# Patient Record
Sex: Female | Born: 1960
Health system: Southern US, Community
[De-identification: ages and names within clinical notes are randomized; demographics above are authoritative.]

## PROBLEM LIST (undated history)

## (undated) DIAGNOSIS — G7000 Myasthenia gravis without (acute) exacerbation: Secondary | ICD-10-CM

## (undated) DIAGNOSIS — N809 Endometriosis, unspecified: Secondary | ICD-10-CM

## (undated) DIAGNOSIS — E039 Hypothyroidism, unspecified: Secondary | ICD-10-CM

## (undated) DIAGNOSIS — M797 Fibromyalgia: Secondary | ICD-10-CM

## (undated) DIAGNOSIS — M069 Rheumatoid arthritis, unspecified: Secondary | ICD-10-CM

## (undated) HISTORY — DX: Rheumatoid arthritis, unspecified: M06.9

## (undated) HISTORY — PX: FOOT SURGERY: SHX648

## (undated) HISTORY — DX: Fibromyalgia: M79.7

## (undated) HISTORY — PX: TONSILECTOMY/ADENOIDECTOMY WITH MYRINGOTOMY: SHX6125

## (undated) HISTORY — DX: Hypothyroidism, unspecified: E03.9

## (undated) HISTORY — DX: Endometriosis, unspecified: N80.9

## (undated) HISTORY — PX: FEMORAL HERNIA REPAIR: SUR1179

---

## 1998-10-23 ENCOUNTER — Other Ambulatory Visit: Admission: RE | Admit: 1998-10-23 | Discharge: 1998-10-23 | Payer: Self-pay | Admitting: Obstetrics and Gynecology

## 1999-08-02 ENCOUNTER — Ambulatory Visit (HOSPITAL_COMMUNITY): Admission: RE | Admit: 1999-08-02 | Discharge: 1999-08-02 | Payer: Self-pay

## 1999-10-24 ENCOUNTER — Other Ambulatory Visit: Admission: RE | Admit: 1999-10-24 | Discharge: 1999-10-24 | Payer: Self-pay | Admitting: Obstetrics and Gynecology

## 2000-03-26 ENCOUNTER — Ambulatory Visit (HOSPITAL_COMMUNITY): Admission: RE | Admit: 2000-03-26 | Discharge: 2000-03-26 | Payer: Self-pay | Admitting: Neurology

## 2000-11-28 ENCOUNTER — Other Ambulatory Visit: Admission: RE | Admit: 2000-11-28 | Discharge: 2000-11-28 | Payer: Self-pay | Admitting: Obstetrics and Gynecology

## 2001-11-30 ENCOUNTER — Other Ambulatory Visit: Admission: RE | Admit: 2001-11-30 | Discharge: 2001-11-30 | Payer: Self-pay | Admitting: *Deleted

## 2002-12-22 ENCOUNTER — Other Ambulatory Visit: Admission: RE | Admit: 2002-12-22 | Discharge: 2002-12-22 | Payer: Self-pay | Admitting: Obstetrics and Gynecology

## 2003-04-13 ENCOUNTER — Encounter: Payer: Self-pay | Admitting: General Surgery

## 2003-04-13 ENCOUNTER — Encounter: Admission: RE | Admit: 2003-04-13 | Discharge: 2003-04-13 | Payer: Self-pay | Admitting: General Surgery

## 2003-06-02 ENCOUNTER — Emergency Department (HOSPITAL_COMMUNITY): Admission: EM | Admit: 2003-06-02 | Discharge: 2003-06-03 | Payer: Self-pay | Admitting: *Deleted

## 2003-08-05 ENCOUNTER — Encounter: Payer: Self-pay | Admitting: Family Medicine

## 2003-08-05 ENCOUNTER — Encounter: Admission: RE | Admit: 2003-08-05 | Discharge: 2003-08-05 | Payer: Self-pay | Admitting: Family Medicine

## 2003-12-19 ENCOUNTER — Encounter: Admission: RE | Admit: 2003-12-19 | Discharge: 2003-12-19 | Payer: Self-pay | Admitting: Family Medicine

## 2004-01-19 ENCOUNTER — Other Ambulatory Visit: Admission: RE | Admit: 2004-01-19 | Discharge: 2004-01-19 | Payer: Self-pay | Admitting: Obstetrics and Gynecology

## 2004-12-27 ENCOUNTER — Ambulatory Visit (HOSPITAL_COMMUNITY): Admission: RE | Admit: 2004-12-27 | Discharge: 2004-12-27 | Payer: Self-pay | Admitting: Obstetrics and Gynecology

## 2005-02-21 ENCOUNTER — Encounter: Admission: RE | Admit: 2005-02-21 | Discharge: 2005-02-21 | Payer: Self-pay | Admitting: Obstetrics and Gynecology

## 2005-05-10 ENCOUNTER — Ambulatory Visit (HOSPITAL_COMMUNITY): Admission: RE | Admit: 2005-05-10 | Discharge: 2005-05-10 | Payer: Self-pay | Admitting: Obstetrics and Gynecology

## 2005-12-27 ENCOUNTER — Other Ambulatory Visit: Admission: RE | Admit: 2005-12-27 | Discharge: 2005-12-27 | Payer: Self-pay | Admitting: Obstetrics and Gynecology

## 2007-02-23 ENCOUNTER — Encounter: Admission: RE | Admit: 2007-02-23 | Discharge: 2007-02-23 | Payer: Self-pay | Admitting: Obstetrics and Gynecology

## 2008-07-11 ENCOUNTER — Emergency Department (HOSPITAL_COMMUNITY): Admission: EM | Admit: 2008-07-11 | Discharge: 2008-07-11 | Payer: Self-pay | Admitting: Emergency Medicine

## 2008-11-24 ENCOUNTER — Ambulatory Visit (HOSPITAL_BASED_OUTPATIENT_CLINIC_OR_DEPARTMENT_OTHER): Admission: RE | Admit: 2008-11-24 | Discharge: 2008-11-24 | Payer: Self-pay | Admitting: Surgery

## 2008-11-24 ENCOUNTER — Encounter (INDEPENDENT_AMBULATORY_CARE_PROVIDER_SITE_OTHER): Payer: Self-pay | Admitting: Surgery

## 2009-03-09 ENCOUNTER — Encounter: Admission: RE | Admit: 2009-03-09 | Discharge: 2009-03-09 | Payer: Self-pay | Admitting: Gastroenterology

## 2009-03-23 ENCOUNTER — Encounter: Admission: RE | Admit: 2009-03-23 | Discharge: 2009-03-23 | Payer: Self-pay | Admitting: Obstetrics and Gynecology

## 2009-05-25 ENCOUNTER — Ambulatory Visit (HOSPITAL_COMMUNITY): Admission: RE | Admit: 2009-05-25 | Discharge: 2009-05-25 | Payer: Self-pay | Admitting: Gastroenterology

## 2010-03-22 ENCOUNTER — Encounter: Admission: RE | Admit: 2010-03-22 | Discharge: 2010-03-22 | Payer: Self-pay | Admitting: Family Medicine

## 2010-08-31 ENCOUNTER — Ambulatory Visit (HOSPITAL_COMMUNITY)
Admission: RE | Admit: 2010-08-31 | Discharge: 2010-08-31 | Payer: Self-pay | Source: Home / Self Care | Admitting: Family Medicine

## 2011-01-06 ENCOUNTER — Encounter: Payer: Self-pay | Admitting: Orthopedic Surgery

## 2011-01-06 ENCOUNTER — Encounter: Payer: Self-pay | Admitting: Obstetrics and Gynecology

## 2011-01-15 ENCOUNTER — Encounter
Admission: RE | Admit: 2011-01-15 | Discharge: 2011-01-15 | Payer: Self-pay | Source: Home / Self Care | Attending: Family Medicine | Admitting: Family Medicine

## 2011-04-30 NOTE — Op Note (Signed)
NAMESEQUOYA, [name]               ACCOUNT NO.:  [id number]   MEDICAL RECORD NO.:  [id number]          PATIENT TYPE:  AMB   LOCATION:  DSC                          FACILITY:  [facility]   PHYSICIAN:  Sandria Bales. Ezzard Standing, M.D.  DATE OF BIRTH:  Sep 05, 1961   DATE OF PROCEDURE:  11/24/2008  DATE OF DISCHARGE:                               OPERATIVE REPORT   Date of surgery ?   PREOPERATIVE DIAGNOSIS:  Right groin mass.   POSTOPERATIVE DIAGNOSIS:  Right femoral hernia.  Chronically  incarcerated.   PROCEDURE:  Open right femoral hernia repair with mesh.   SURGEON:  Sandria Bales. Ezzard Standing, MD   FIRST ASSISTANT:  None.   ANESTHESIA:  General endotracheal.   ESTIMATED BLOOD LOSS:  Minimal.   INDICATIONS FOR PROCEDURE:  Angel Roberts is a 50 year old white female who  is a patient of Dr. Merri Roberts, who sees Dr. Richarda Roberts from a  GYN standpoint, who has had a right groin mass that she has noticed for  several months.  She actually had a CT scan of this which showed a 1.6 x  2.3 cystic mass in her right groin and was hard to tell on physical exam  whether this was some lymph node or femoral hernia versus some other  kind of mass.   The area was not hurting her.  She was just conscious of it.  We  discussed about the options for treatment, but she decided she will be  best served by having this excised.   I discussed with her the risks of surgery which includes bleeding,  infection, and nerve injury and if this is a hernia, we have to do some  kind of hernia repair.   OPERATIVE NOTE:  The patient was placed in supine position and given a  general endotracheal anesthetic.  She was given 1 g of Ancef at the  initiation of the procedure.  I ultrasound'ed this area at the beginning  of the procedure to make sure I had the location right and marked her  skin.   I then made a right inguinal incision, cut down to this mass which was  immediately below the inguinal ligament.  It was fairly clear  once I got  top of this, this was a component of the femoral hernia with sort of  cystic degeneration in it.   I then opened the external oblique fascia, reduced the hernia above the  ilioinguinal ligament.  I ligated this hernia sac after opening and saw  no bowel or bladder in it.  I then divided off the round ligament of the  pubic bone and ligated this at the internal ring/canal with a 0 chromic  suture.  I ligated the hernia sac with 0 chromic suture.  I then used a  piece of Prolene mesh to do an inguinal floor repair.  I sewed the mesh  medially to pubic bone inferiorly to Cooper ligament, superiorly to the  transversalis fascia but covered both the femoral hernia defect and the  internal ring defect.  I used interrupted 0 Novafil sutures for this  repair.   I then infiltrated the fascial spaces with 20cc .25% Marcaine.  I closed  the external oblique fascia with interrupted 3-0 Vicryl suture,  subcutaneous tissues with 3-0 Vicryl suture, and skin with a 4-0 Vicryl  suture, painted with benzoin and Steri-Strip'ed.   The patient tolerated the procedure well.  This was done as an  outpatient, will go home today.  Sponge and needle counts were correct at the end of the case and the  patient transferred to recovery room in good condition.      Sandria Bales. Ezzard Standing, M.D.  Electronically Signed     DHN/MEDQ  D:  11/24/2008  T:  11/25/2008  Job:  [id number]   cc:   Angel Roberts, M.D.  Angel Roberts, M.D.

## 2011-05-03 NOTE — H&P (Signed)
Angel Roberts, Angel Roberts               ACCOUNT NO.:  [id number]   MEDICAL RECORD NO.:  [id number]          PATIENT TYPE:  AMB   LOCATION:  SDC                           [facility]ILITY:  [facility]   PHYSICIAN:  Duke Salvia. Marcelle Overlie, M.D.DATE OF BIRTH:  03-02-1961   DATE OF ADMISSION:  DATE OF DISCHARGE:                                HISTORY & PHYSICAL   DATE OF OUTPATIENT SURGERY:  December 27, 2004   CHIEF COMPLAINT:  Menorrhagia.   HISTORY OF PRESENT ILLNESS:  A 50 year old G3 P3, her husband has had a  vasectomy, with menorrhagia.  She underwent D&C hysteroscopy October 2004 to  remove a polyp.  Findings at that time showed benign pathology but she  continues to have heavy bleeding and presents now for NovaSure EMA.  This  procedure including risks of bleeding, infection, other complications that  may require open or additional surgery all discussed with her, which she  understands and accepts.  Prior [facility] done September 2004 was normal except  for a small fibroid and several small polyps which have since been removed.   PAST MEDICAL HISTORY:  Allergies:  INDOCIN, FLAGYL, SULFASALAZINE,  ERYTHROMYCIN.  Current medications:  Synthroid, prednisone, Plaquenil for  rheumatoid arthritis.   REVIEW OF SYSTEMS:  Significant for rheumatoid arthritis.  She has had three  vaginal deliveries at term without complication.  Also history of  fibromyalgia followed by Dr. Estill Bakes.   FAMILY HISTORY:  Significant for a father and father-in-law with  hypertension.  Mother who had skin cancer.   PHYSICAL EXAMINATION:  VITAL SIGNS:  Temperature 98.2, blood pressure  120/72.  HEENT:  Unremarkable.  NECK:  Supple without masses.  LUNGS:  Clear.  CARDIOVASCULAR:  Regular rate and rhythm without murmurs, rubs, or gallops  noted.  BREASTS:  Without masses.  ABDOMEN:  Soft, flat, and nontender.  PELVIC:  Normal external genitalia, vagina and cervix clear.  Uterus mid  position, normal size.  Adnexa negative.  EXTREMITIES AND NEUROLOGIC:  Unremarkable.   IMPRESSION:  Persistent menorrhagia status post D&C hysteroscopy.   PLAN:  NovaSure endometrial ablation.  Procedure and risks reviewed as  above.     Rich   RMH/MEDQ  D:  12/24/2004  T:  12/24/2004  Job:  [id number]

## 2011-05-03 NOTE — Op Note (Signed)
Angel Roberts, Angel Roberts               ACCOUNT NO.:  [id number]   MEDICAL RECORD NO.:  [id number]          PATIENT TYPE:  AMB   LOCATION:  SDC                           [facility]ILITY[facility]  [facility]   PHYSICIAN:  Duke Salvia. Marcelle Overlie, M.D.DATE OF BIRTH:  Sep 14, 1961   DATE OF PROCEDURE:  12/27/2004  DATE OF DISCHARGE:                                 OPERATIVE REPORT   PREOPERATIVE DIAGNOSIS:  Menorrhagia.   POSTOPERATIVE DIAGNOSIS:  Menorrhagia.   PROCEDURE:  NovaSure endometrial ablation.   SURGEON:  Duke Salvia. Marcelle Overlie, M.D.   ANESTHESIA:  Sedation plus paracervical block.   ESTIMATED BLOOD LOSS:  Less than 5 mL.   PROCEDURE AND FINDINGS:  The patient was taken to the operating room.  After  an adequate level of sedation was obtained with the patient's legs in  stirrups, the perineum and vagina were prepped and draped in the usual  manner for vaginal procedures.  The bladder was drained, EUA carried out.  The uterus was normal size, mobile, adnexa negative.  A speculum was  positioned, paracervical block created by infiltrating at 3 and 9 o'clock  submucosally, 5-7 mL of 1% Xylocaine on either side after a negative  aspiration.  The uterus was then sounded to 9.5 cm, progressively dilated to  a 29 Pratt dilator.  The endocervical measurement was 3.5.  The NovaSure  instrument was then placed per protocol at a fundal cavity setting of 6 cm  with a width measurement of 4.5.  Passed the CO2 test and procedure carried  out 1 minute 14 seconds at a power of 149.  She tolerated this well, went to  the recovery room in good condition.     Rich   RMH/MEDQ  D:  12/27/2004  T:  12/27/2004  Job:  [id number]

## 2011-09-20 LAB — POCT HEMOGLOBIN-HEMACUE: Hemoglobin: 12.8 g/dL (ref 12.0–15.0)

## 2012-02-18 ENCOUNTER — Other Ambulatory Visit: Payer: Self-pay | Admitting: Obstetrics and Gynecology

## 2012-02-18 DIAGNOSIS — N63 Unspecified lump in unspecified breast: Secondary | ICD-10-CM

## 2012-02-26 ENCOUNTER — Ambulatory Visit
Admission: RE | Admit: 2012-02-26 | Discharge: 2012-02-26 | Disposition: A | Discharge status: Home / Self Care | Payer: BC Managed Care – PPO | Source: Ambulatory Visit | Attending: Obstetrics and Gynecology | Admitting: Obstetrics and Gynecology

## 2012-02-26 DIAGNOSIS — N63 Unspecified lump in unspecified breast: Secondary | ICD-10-CM

## 2012-09-07 ENCOUNTER — Other Ambulatory Visit: Payer: Self-pay | Admitting: Rheumatology

## 2012-09-07 DIAGNOSIS — M5412 Radiculopathy, cervical region: Secondary | ICD-10-CM

## 2012-09-10 ENCOUNTER — Other Ambulatory Visit: Payer: BC Managed Care – PPO

## 2013-01-12 ENCOUNTER — Other Ambulatory Visit: Payer: Self-pay | Admitting: Rheumatology

## 2013-01-12 DIAGNOSIS — M629 Disorder of muscle, unspecified: Secondary | ICD-10-CM

## 2013-01-16 ENCOUNTER — Ambulatory Visit
Admission: RE | Admit: 2013-01-16 | Discharge: 2013-01-16 | Disposition: A | Discharge status: Home / Self Care | Payer: BC Managed Care – PPO | Source: Ambulatory Visit | Attending: Rheumatology | Admitting: Rheumatology

## 2013-01-16 DIAGNOSIS — M629 Disorder of muscle, unspecified: Secondary | ICD-10-CM

## 2013-01-16 MED ORDER — GADOBENATE DIMEGLUMINE 529 MG/ML IV SOLN
13.0000 mL | Freq: Once | INTRAVENOUS | Status: AC | PRN
  Administered 2013-01-16: 13 mL via INTRAVENOUS

## 2014-10-12 ENCOUNTER — Other Ambulatory Visit: Payer: Self-pay | Admitting: Obstetrics and Gynecology

## 2014-10-13 LAB — CYTOLOGY - PAP

## 2014-12-14 ENCOUNTER — Encounter: Payer: Self-pay | Admitting: Neurology

## 2014-12-19 ENCOUNTER — Ambulatory Visit (INDEPENDENT_AMBULATORY_CARE_PROVIDER_SITE_OTHER): Payer: BC Managed Care – PPO | Admitting: Neurology

## 2014-12-19 ENCOUNTER — Encounter: Payer: Self-pay | Admitting: Neurology

## 2014-12-19 VITALS — BP 126/70 | HR 70 | Temp 98.0°F | Resp 16 | Ht 68.0 in | Wt 155.7 lb

## 2014-12-19 DIAGNOSIS — R531 Weakness: Secondary | ICD-10-CM

## 2014-12-19 DIAGNOSIS — H02403 Unspecified ptosis of bilateral eyelids: Secondary | ICD-10-CM

## 2014-12-19 LAB — CK: Total CK: 499 U/L — ABNORMAL HIGH (ref 7–177)

## 2014-12-19 NOTE — Progress Notes (Addendum)
NEUROLOGY CONSULTATION NOTE  MERRISA SKORUPSKI MRN: [id number] DOB: 23-Sep-1961  Referring provider: Dr. Dema Severin Primary care provider: Dr. Dema Severin  Reason for consult:  Bilateral ptosis  HISTORY OF PRESENT ILLNESS: Angel Roberts is a 54 year old right-handed woman with rheumatoid arthritis, hypothyroidism and fibromyalgia who presents for bilateral ptosis and low back pain.  Records reviewed.  She has been noticing symptoms for a few months.  She has noticed that her eyes are drooping.  Left is worse than right.  Sometimes, her vision seems blurry and difficult to focus.  She also notes weakness and fatigue involving her arms and legs.  It is difficult to walk up the stairs at time.  She also occasionally notes difficulty swallowing as well as mildly out of breath.  Her head feels heavy sometimes.  These symptoms fluctuate but mostly are more evident later in the day.  She initially had some pain in the arms, but not recently.  She has rheumatoid arthritis and fibromyalgia, for which she is taking Plaquenil and naproxen.  PAST MEDICAL HISTORY: Past Medical History  Diagnosis Date  . Endometriosis   . Rheumatoid arthritis   . Fibromyalgia   . Hypothyroid     PAST SURGICAL HISTORY: Past Surgical History  Procedure Laterality Date  . Foot surgery      MEDICATIONS: Current Outpatient Prescriptions on File Prior to Visit  Medication Sig Dispense Refill  . hydroxychloroquine (PLAQUENIL) 200 MG tablet Take 200 mg by mouth 2 (two) times daily.    Marland Kitchen levothyroxine (SYNTHROID, LEVOTHROID) 125 MCG tablet Take 125 mcg by mouth daily before breakfast.    . naproxen (NAPROSYN) 500 MG tablet Take 500 mg by mouth 2 (two) times daily with a meal.     No current facility-administered medications on file prior to visit.    ALLERGIES: Allergies  Allergen Reactions  . Augmentin [Amoxicillin-Pot Clavulanate] Diarrhea  . Erythromycin Nausea And Vomiting  . Flagyl [Metronidazole]     fainting    . Indocin [Indomethacin]     Headache   . Sulfa Antibiotics     FAMILY HISTORY: Family History  Problem Relation Age of Onset  . Hypertension Mother   . Asthma Mother   . Hypertension Father   . HIV Brother   . Thyroid disease Daughter   . Stroke Paternal Grandmother   . COPD Paternal Grandfather     SOCIAL HISTORY: History   Social History  . Marital Status: Married    Spouse Name: N/A    Number of Children: N/A  . Years of Education: N/A   Occupational History  . Not on file.   Social History Main Topics  . Smoking status: Never Smoker   . Smokeless tobacco: Never Used  . Alcohol Use: 0.0 oz/week    0 Not specified per week     Comment: rare  . Drug Use: No     Comment: PATIENT FEELS SAFE IN HER HOME .  Marland Kitchen Sexual Activity:    Partners: Male   Other Topics Concern  . Not on file   Social History Narrative    REVIEW OF SYSTEMS: Constitutional: No fevers, chills, or sweats, no generalized fatigue, change in appetite Eyes: No visual changes, double vision, eye pain Ear, nose and throat: No hearing loss, ear pain, nasal congestion, sore throat Cardiovascular: No chest pain, palpitations Respiratory:  No shortness of breath at rest or with exertion, wheezes GastrointestinaI: No nausea, vomiting, diarrhea, abdominal pain, fecal incontinence Genitourinary:  No dysuria, urinary  retention or frequency Musculoskeletal:  Back and neck pain Integumentary: No rash, pruritus, skin lesions Neurological: as above Psychiatric: No depression, insomnia, anxiety Endocrine: No palpitations, fatigue, diaphoresis, mood swings, change in appetite, change in weight, increased thirst Hematologic/Lymphatic:  No anemia, purpura, petechiae. Allergic/Immunologic: no itchy/runny eyes, nasal congestion, recent allergic reactions, rashes  PHYSICAL EXAM: Filed Vitals:   12/19/14 1510  BP: 126/70  Pulse: 70  Temp: 98 F (36.7 C)  Resp: 16   General: No acute distress Head:   Normocephalic/atraumatic Eyes:  fundi unremarkable, without vessel changes, exudates, hemorrhages or papilledema. Neck: supple, no paraspinal tenderness, full range of motion Back: No paraspinal tenderness Heart: regular rate and rhythm Lungs: Clear to auscultation bilaterally. Vascular: No carotid bruits. Neurological Exam: Mental status: alert and oriented to person, place, and time, recent and remote memory intact, fund of knowledge intact, attention and concentration intact, speech fluent and not dysarthric, language intact. Cranial nerves: CN I: not tested CN II: pupils equal, round and reactive to light, visual fields intact, fundi unremarkable, without vessel changes, exudates, hemorrhages or papilledema. CN III, IV, VI:  full range of motion, no nystagmus, mild bilateral ptosis which does not appear to fatigue or improve with ice CN V: endorses very mild numbness in left V2 distribution. CN VII: upper and lower face symmetric CN VIII: hearing intact CN IX, X: gag intact, uvula midline CN XI: sternocleidomastoid and trapezius muscles intact CN XII: tongue midline Bulk & Tone: normal, no fasciculations. Motor:  5/5 throughout.  4+/5 neck flexion and extension, however reduced effort due to fear of producing neck pain. Sensation:  Temperature and vibration intact. Deep Tendon Reflexes:  2+ throughout, toes downgoing. Finger to nose testing:  No dysmetria Heel to shin:  No dysmeria Gait:  Normal station and stride.  Able to turn and walk in tandem. Romberg negative.  IMPRESSION: Bilateral ptosis Diffuse weakness Need to rule out myasthenia gravis.  PLAN: 1.  Will check Myasthenia panel and CK 2.  If negative, will check repetitive stimulation test on nerve conduction study 3.  If labs positive, will start Mestinon. 4.  Follow up in 4 weeks or after EMG if performed.  Thank you for allowing me to take part in the care of this patient.  Metta Clines, DO  CC: Harlan Stains,  MD

## 2014-12-19 NOTE — Progress Notes (Signed)
, °

## 2014-12-19 NOTE — Addendum Note (Signed)
Addended byTomi Likens, Zania Kalisz R on: 12/19/2014 04:20 PM   Modules accepted: Level of Service

## 2014-12-19 NOTE — Patient Instructions (Signed)
We will check blood work looking for evidence of myasthenia gravis. If the blood work is negative, we will order a nerve conduction study to test for it. I would like to see you in 4 weeks.  If blood work is positive, we will start a medication for myasthenia (Mestinon)

## 2014-12-26 LAB — ACETYLCHOLINE RECEPTOR, BINDING: A CHR BINDING ABS: 0.53 nmol/L — AB (ref <=0.30)

## 2014-12-26 LAB — STRIATED MUSCLE ANTIBODY: Striated Muscle Ab: <1:40 {titer}

## 2014-12-27 ENCOUNTER — Telehealth: Payer: Self-pay | Admitting: *Deleted

## 2014-12-27 ENCOUNTER — Other Ambulatory Visit: Payer: Self-pay | Admitting: *Deleted

## 2014-12-27 MED ORDER — PYRIDOSTIGMINE BROMIDE 60 MG PO TABS: 60.0000 mg | ORAL_TABLET | Freq: Three times a day (TID) | ORAL | Status: DC

## 2014-12-27 NOTE — Telephone Encounter (Signed)
-----   Message from Alda Berthold, DO sent at 12/27/2014  9:01 AM EST ----- Please inform patient that her labs are suggestive of an autoimmune disease that can cause muscle weakness, myasthenia gravis, and we can start mestinon 60mg  three times daily.  Her CK may need to be rechecked again in a month.  Thanks.

## 2014-12-27 NOTE — Telephone Encounter (Signed)
Patient is aware of lab results and medication was sent to [facility]

## 2015-01-30 ENCOUNTER — Ambulatory Visit: Payer: BC Managed Care – PPO | Admitting: Neurology

## 2015-02-03 ENCOUNTER — Telehealth: Payer: Self-pay | Admitting: *Deleted

## 2015-02-03 NOTE — Telephone Encounter (Signed)
Patient called asking if records were sent to Dr Nadara Mustard and [facility] I advised her that the request goes to [facility] and they pull the records and fax to the requested DR

## 2015-02-15 ENCOUNTER — Encounter: Payer: Self-pay | Admitting: Neurology

## 2015-02-15 ENCOUNTER — Ambulatory Visit (INDEPENDENT_AMBULATORY_CARE_PROVIDER_SITE_OTHER): Payer: BLUE CROSS/BLUE SHIELD | Admitting: Neurology

## 2015-02-15 VITALS — BP 122/68 | HR 78 | Resp 16 | Ht 68.0 in | Wt 154.3 lb

## 2015-02-15 DIAGNOSIS — R0602 Shortness of breath: Secondary | ICD-10-CM

## 2015-02-15 DIAGNOSIS — D4989 Neoplasm of unspecified behavior of other specified sites: Secondary | ICD-10-CM

## 2015-02-15 DIAGNOSIS — D150 Benign neoplasm of thymus: Secondary | ICD-10-CM

## 2015-02-15 MED ORDER — PYRIDOSTIGMINE BROMIDE 60 MG PO TABS: 60.0000 mg | ORAL_TABLET | Freq: Four times a day (QID) | ORAL | Status: DC

## 2015-02-15 NOTE — Progress Notes (Signed)
NEUROLOGY FOLLOW UP OFFICE NOTE  [name] [id number]  HISTORY OF PRESENT ILLNESS: Angel Roberts is a 54 year old right-handed woman with rheumatoid arthritis, hypothyroidism and fibromyalgia who follows up for bilateral ptosis.  Labs reviewed.  UPDATE: Myasthenia panel showed elevated Ach binding antibodies of 0.53.  Striated muscle antibodies were negative.  She was started on Mestinon 60mg  three times daily.  At first, she noted diarrhea, but has since adjusted.  She feels that the ptosis has improved somewhat.  She sometimes has trouble swallowing food and will need to drink some water to wash it down.  When she is active, she feels short of breath.   HISTORY: She has been noticing symptoms for a few months.  She has noticed that her eyes are drooping.  Left is worse than right.  Sometimes, her vision seems blurry and difficult to focus.  She also notes weakness and fatigue involving her arms and legs.  It is difficult to walk up the stairs at time.  She also occasionally notes difficulty swallowing as well as mildly out of breath.  Her head feels heavy sometimes.  These symptoms fluctuate but mostly are more evident later in the day.  She initially had some pain in the arms, but not recently.  She has hypothyroidism, rheumatoid arthritis and fibromyalgia, for which she is taking Synthroid, Plaquenil and naproxen.  PAST MEDICAL HISTORY: Past Medical History  Diagnosis Date  . Endometriosis   . Rheumatoid arthritis   . Fibromyalgia   . Hypothyroid     MEDICATIONS: Current Outpatient Prescriptions on File Prior to Visit  Medication Sig Dispense Refill  . hydroxychloroquine (PLAQUENIL) 200 MG tablet Take 200 mg by mouth 2 (two) times daily.    Marland Kitchen levothyroxine (SYNTHROID, LEVOTHROID) 125 MCG tablet Take 125 mcg by mouth daily before breakfast.    . naproxen (NAPROSYN) 500 MG tablet Take 500 mg by mouth 2 (two) times daily with a meal.     No current facility-administered  medications on file prior to visit.    ALLERGIES: Allergies  Allergen Reactions  . Other Anaphylaxis    Shell fish   . Augmentin [Amoxicillin-Pot Clavulanate] Diarrhea  . Erythromycin Nausea And Vomiting  . Flagyl [Metronidazole]     fainting  . Indocin [Indomethacin]     Headache   . Sulfa Antibiotics     FAMILY HISTORY: Family History  Problem Relation Age of Onset  . Hypertension Mother   . Asthma Mother   . Hypertension Father   . HIV Brother   . Thyroid disease Daughter   . Stroke Paternal Grandmother   . COPD Paternal Grandfather     SOCIAL HISTORY: History   Social History  . Marital Status: Married    Spouse Name: N/A  . Number of Children: N/A  . Years of Education: N/A   Occupational History  . Not on file.   Social History Main Topics  . Smoking status: Never Smoker   . Smokeless tobacco: Never Used  . Alcohol Use: 0.0 oz/week    0 Standard drinks or equivalent per week     Comment: rare  . Drug Use: No     Comment: PATIENT FEELS SAFE IN HER HOME .  Marland Kitchen Sexual Activity:    Partners: Male   Other Topics Concern  . Not on file   Social History Narrative    REVIEW OF SYSTEMS: Constitutional: No fevers, chills, or sweats, no generalized fatigue, change in appetite Eyes: No visual changes,  double vision, eye pain Ear, nose and throat: No hearing loss, ear pain, nasal congestion, sore throat Cardiovascular: No chest pain, palpitations Respiratory:  As above. GastrointestinaI: No nausea, vomiting, diarrhea, abdominal pain, fecal incontinence Genitourinary:  No dysuria, urinary retention or frequency Musculoskeletal:  No neck pain, back pain Integumentary: No rash, pruritus, skin lesions Neurological: as above Psychiatric: No depression, insomnia, anxiety Endocrine: No palpitations, fatigue, diaphoresis, mood swings, change in appetite, change in weight, increased thirst Hematologic/Lymphatic:  No anemia, purpura,  petechiae. Allergic/Immunologic: no itchy/runny eyes, nasal congestion, recent allergic reactions, rashes  PHYSICAL EXAM: Filed Vitals:   02/15/15 1341  BP: 122/68  Pulse: 78  Resp: 16   General: No acute distress Head:  Normocephalic/atraumatic Eyes:  Fundoscopic exam unremarkable without vessel changes, exudates, hemorrhages or papilledema. Neck: supple, no paraspinal tenderness, full range of motion Heart:  Regular rate and rhythm Lungs:  Clear to auscultation bilaterally Back: No paraspinal tenderness Neurological Exam: alert and oriented to person, place, and time. Attention span and concentration intact, recent and remote memory intact, fund of knowledge intact.  Speech fluent and not dysarthric, language intact.  Bilateral ptosis.  Otherwise CN II-XII intact. Fundoscopic exam unremarkable without vessel changes, exudates, hemorrhages or papilledema.  Bulk and tone normal, neck flexion 4+/5, otherwise muscle strength 5/5 throughout.  Sensation to light touch, temperature and vibration intact.  Deep tendon reflexes 2+ throughout, toes downgoing.  Finger to nose and heel to shin testing intact.  Gait normal  IMPRESSION: Myasthenia gravis  PLAN: 1.  Will increase Mestinon to 60mg  4 times daily 2.  Will check CT of chest for thymoma 3.  Follow up in 2 months but she is instructed to call sooner with any issues (medication side effects, worsening symptoms, etc.)  15 minutes spent with patient, over 50% spent discussing physiology of condition and coordinating care.  Metta Clines, DO  CC:  Carol Ada, MD

## 2015-02-15 NOTE — Patient Instructions (Addendum)
1.  Increase Mestinon 60mg  to 4 times daily 2.  Will get CT of chest to evaluate for thymoma [facility]  02/20/15 at 7:45am  3.  Follow up in 2 months but please call with any concerns (side effects to Mestinon, worsening symptoms)

## 2015-02-20 ENCOUNTER — Ambulatory Visit (HOSPITAL_COMMUNITY)
Admission: RE | Admit: 2015-02-20 | Discharge: 2015-02-20 | Disposition: A | Discharge status: Home / Self Care | Payer: BLUE CROSS/BLUE SHIELD | Source: Ambulatory Visit | Attending: Neurology | Admitting: Neurology

## 2015-02-20 DIAGNOSIS — R0602 Shortness of breath: Secondary | ICD-10-CM

## 2015-02-20 DIAGNOSIS — R918 Other nonspecific abnormal finding of lung field: Secondary | ICD-10-CM | POA: Diagnosis not present

## 2015-02-20 DIAGNOSIS — D150 Benign neoplasm of thymus: Secondary | ICD-10-CM

## 2015-02-20 DIAGNOSIS — D4989 Neoplasm of unspecified behavior of other specified sites: Secondary | ICD-10-CM

## 2015-02-20 DIAGNOSIS — G7000 Myasthenia gravis without (acute) exacerbation: Secondary | ICD-10-CM | POA: Insufficient documentation

## 2015-02-22 ENCOUNTER — Telehealth: Payer: Self-pay | Admitting: *Deleted

## 2015-02-22 ENCOUNTER — Telehealth: Payer: Self-pay | Admitting: Neurology

## 2015-02-22 NOTE — Telephone Encounter (Signed)
Left a message for patient to return call x 2

## 2015-02-22 NOTE — Telephone Encounter (Signed)
-----   Message from Pieter Partridge, DO sent at 02/22/2015  6:25 AM EST ----- CT chest shows no thymoma.   ----- Message -----    From: Rad Results In Interface    Sent: 02/20/2015   9:25 AM      To: Dudley Major, DO

## 2015-02-22 NOTE — Telephone Encounter (Signed)
Pt states that is returning someone call and thinks it is about a test results please call her at [phone]

## 2015-02-22 NOTE — Telephone Encounter (Signed)
Patient is aware of CT findings she will contact office next week with a name of who is in her network .

## 2015-02-22 NOTE — Telephone Encounter (Signed)
I called patient and left message for patient to return my call  Regarding CT of CHEST and to make an appt with pulmonary DR

## 2015-02-26 ENCOUNTER — Emergency Department (HOSPITAL_COMMUNITY): Payer: BLUE CROSS/BLUE SHIELD

## 2015-02-26 ENCOUNTER — Emergency Department (HOSPITAL_COMMUNITY)
Admission: EM | Admit: 2015-02-26 | Discharge: 2015-02-26 | Disposition: A | Discharge status: Home / Self Care | Payer: BLUE CROSS/BLUE SHIELD | Attending: Emergency Medicine | Admitting: Emergency Medicine

## 2015-02-26 ENCOUNTER — Encounter (HOSPITAL_COMMUNITY): Payer: Self-pay

## 2015-02-26 DIAGNOSIS — E039 Hypothyroidism, unspecified: Secondary | ICD-10-CM | POA: Insufficient documentation

## 2015-02-26 DIAGNOSIS — Z8669 Personal history of other diseases of the nervous system and sense organs: Secondary | ICD-10-CM | POA: Insufficient documentation

## 2015-02-26 DIAGNOSIS — Z87448 Personal history of other diseases of urinary system: Secondary | ICD-10-CM | POA: Insufficient documentation

## 2015-02-26 DIAGNOSIS — Z8739 Personal history of other diseases of the musculoskeletal system and connective tissue: Secondary | ICD-10-CM | POA: Diagnosis not present

## 2015-02-26 DIAGNOSIS — R531 Weakness: Secondary | ICD-10-CM | POA: Insufficient documentation

## 2015-02-26 DIAGNOSIS — R0602 Shortness of breath: Secondary | ICD-10-CM

## 2015-02-26 DIAGNOSIS — Z79899 Other long term (current) drug therapy: Secondary | ICD-10-CM | POA: Diagnosis not present

## 2015-02-26 DIAGNOSIS — Z791 Long term (current) use of non-steroidal anti-inflammatories (NSAID): Secondary | ICD-10-CM | POA: Diagnosis not present

## 2015-02-26 HISTORY — DX: Myasthenia gravis without (acute) exacerbation: G70.00

## 2015-02-26 LAB — BASIC METABOLIC PANEL
ANION GAP: 8 (ref 5–15)
BUN: 16 mg/dL (ref 6–23)
CALCIUM: 9.7 mg/dL (ref 8.4–10.5)
CO2: 30 mmol/L (ref 19–32)
CREATININE: 0.62 mg/dL (ref 0.50–1.10)
Chloride: 101 mmol/L (ref 96–112)
GFR calc non Af Amer: >90 mL/min (ref >90)
GLUCOSE: 141 mg/dL — AB (ref 70–99)
Potassium: 4.3 mmol/L (ref 3.5–5.1)
SODIUM: 139 mmol/L (ref 135–145)

## 2015-02-26 LAB — CBC
HCT: 38.5 % (ref 36.0–46.0)
HEMOGLOBIN: 12.5 g/dL (ref 12.0–15.0)
MCH: 30 pg (ref 26.0–34.0)
MCHC: 32.5 g/dL (ref 30.0–36.0)
MCV: 92.5 fL (ref 78.0–100.0)
Platelets: 220 10*3/uL (ref 150–400)
RBC: 4.16 MIL/uL (ref 3.87–5.11)
RDW: 13.1 % (ref 11.5–15.5)
WBC: 5.7 10*3/uL (ref 4.0–10.5)

## 2015-02-26 LAB — I-STAT TROPONIN, ED: Troponin i, poc: 0 ng/mL (ref 0.00–0.08)

## 2015-02-26 LAB — D-DIMER, QUANTITATIVE (NOT AT ARMC): D DIMER QUANT: 0.48 ug{FEU}/mL (ref 0.00–0.48)

## 2015-02-26 MED ORDER — PYRIDOSTIGMINE BROMIDE 60 MG PO TABS: 60.0000 mg | ORAL_TABLET | Freq: Four times a day (QID) | ORAL | Status: DC

## 2015-02-26 NOTE — Discharge Instructions (Signed)
Our Neurologist has recommended going ahead with the 4 times a day dosing of the Mestinon, all of your other tests are normal.  Please call your doctor for a followup appointment within 24-48 hours. When you talk to your doctor please let them know that you were seen in the emergency department and have them acquire all of your records so that they can discuss the findings with you and formulate a treatment plan to fully care for your new and ongoing problems.

## 2015-02-26 NOTE — ED Notes (Signed)
Pt here for sob and weakness, since flying back from [place], does have hx of Myasthenia Gravis  But has had a change in dosage of meds and wasn't able to take it on time due to flight, also had a choking squeezing sensation in chest now symptoms have subsided.

## 2015-02-26 NOTE — ED Provider Notes (Signed)
CSN: [id number]     Arrival date & time 02/26/15  1120 History   First MD Initiated Contact with Patient 02/26/15 1141     Chief Complaint  Patient presents with  . Shortness of Breath  . Weakness     (Consider location/radiation/quality/duration/timing/severity/associated sxs/prior Treatment) HPI Comments: The patient is a 54 year old female with a history of rheumatoid arthritis and recently diagnosed myasthenia gravis, she has been taking Mestinon for the last month, she was initially prescribed 3 tablets per day, during her trip to [place] this last week she developed some mild shortness of breath while she was there, this was intermittent, associated with some fatigue, became much worse while she was on the airplane and then resolved, was again severe this morning when she woke up and has since resolved. She was encouraged to start taking her medication 4 times a day, she has not started this yet. She denies fevers chills nausea vomiting coughing swelling of the lower extremities or abdominal pain.  Patient is a 54 y.o. female presenting with shortness of breath and weakness. The history is provided by the patient and the spouse.  Shortness of Breath Weakness Associated symptoms include shortness of breath.    Past Medical History  Diagnosis Date  . Endometriosis   . Rheumatoid arthritis   . Fibromyalgia   . Hypothyroid   . Myasthenia gravis    Past Surgical History  Procedure Laterality Date  . Foot surgery     Family History  Problem Relation Age of Onset  . Hypertension Mother   . Asthma Mother   . Hypertension Father   . HIV Brother   . Thyroid disease Daughter   . Stroke Paternal Grandmother   . COPD Paternal Grandfather    History  Substance Use Topics  . Smoking status: Never Smoker   . Smokeless tobacco: Never Used  . Alcohol Use: 0.0 oz/week    0 Standard drinks or equivalent per week     Comment: rare   OB History    No data available     Review  of Systems  Respiratory: Positive for shortness of breath.   Neurological: Positive for weakness.  All other systems reviewed and are negative.     Allergies  Other; Augmentin; Erythromycin; Flagyl; Indocin; and Sulfa antibiotics  Home Medications   Prior to Admission medications   Medication Sig Start Date End Date Taking? Authorizing Provider  b complex vitamins tablet Take 1 tablet by mouth daily.   Yes Historical Provider, MD  hydroxychloroquine (PLAQUENIL) 200 MG tablet Take 200 mg by mouth 2 (two) times daily.   Yes Historical Provider, MD  levothyroxine (SYNTHROID, LEVOTHROID) 125 MCG tablet Take 125 mcg by mouth daily before breakfast.   Yes Historical Provider, MD  Multiple Vitamins-Minerals (MULTIVITAMIN PO) Take by mouth.   Yes Historical Provider, MD  naproxen (NAPROSYN) 500 MG tablet Take 500 mg by mouth 2 (two) times daily with a meal.   Yes Historical Provider, MD  Omega-3 Fatty Acids (FISH OIL PO) Take 1 capsule by mouth daily.   Yes Historical Provider, MD  pyridostigmine (MESTINON) 60 MG tablet Take 1 tablet (60 mg total) by mouth 4 (four) times daily. 02/26/15   Noemi Chapel, MD   BP 104/83 mmHg  Pulse 88  Temp(Src) 98.7 F (37.1 C) (Oral)  Resp 18  Ht 5' 8.5" (1.74 m)  Wt 154 lb (69.854 kg)  BMI 23.07 kg/m2  SpO2 100% Physical Exam  Constitutional: She appears well-developed and well-nourished.  No distress.  HENT:  Head: Normocephalic and atraumatic.  Mouth/Throat: Oropharynx is clear and moist. No oropharyngeal exudate.  Eyes: Conjunctivae and EOM are normal. Pupils are equal, round, and reactive to light. Right eye exhibits no discharge. Left eye exhibits no discharge. No scleral icterus.  Neck: Normal range of motion. Neck supple. No JVD present. No thyromegaly present.  Cardiovascular: Normal rate, regular rhythm, normal heart sounds and intact distal pulses.  Exam reveals no gallop and no friction rub.   No murmur heard. Pulmonary/Chest: Effort normal  and breath sounds normal. No respiratory distress. She has no wheezes. She has no rales.  Abdominal: Soft. Bowel sounds are normal. She exhibits no distension and no mass. There is no tenderness.  Musculoskeletal: Normal range of motion. She exhibits no edema or tenderness.  Lymphadenopathy:    She has no cervical adenopathy.  Neurological: She is alert. Coordination normal.  Skin: Skin is warm and dry. No rash noted. No erythema.  Psychiatric: She has a normal mood and affect. Her behavior is normal.  Nursing note and vitals reviewed.   ED Course  Procedures (including critical care time) Labs Review Labs Reviewed  BASIC METABOLIC PANEL - Abnormal; Notable for the following:    Glucose, Bld 141 (*)    All other components within normal limits  CBC  D-DIMER, QUANTITATIVE  I-STAT TROPOININ, ED    Imaging Review Dg Chest Port 1 View  02/26/2015   CLINICAL DATA:  Shortness of breath and weakness  EXAM: PORTABLE CHEST - 1 VIEW  COMPARISON:  02/20/2015  FINDINGS: The heart size and mediastinal contours are within normal limits. Both lungs are clear. The visualized skeletal structures are unremarkable.  IMPRESSION: No active disease.   Electronically Signed   By: Kerby Moors M.D.   On: 02/26/2015 12:13     EKG Interpretation   Date/Time:  Sunday February 26 2015 11:28:32 EDT Ventricular Rate:  84 PR Interval:  142 QRS Duration: 84 QT Interval:  386 QTC Calculation: 456 R Axis:   76 Text Interpretation:  Normal sinus rhythm Junctional ST depression,  probably normal Borderline ECG since last tracing no significant change  Confirmed by Ersilia Brawley  MD, Ronalee Scheunemann ([id number]) on 02/26/2015 12:07:52 PM      MDM   Final diagnoses:  Shortness of breath    At this time the patient has a normal pulmonary and cardiac exam, in addition she also has a normal neurologic exam with strength sensation and speech. Her vital signs are unremarkable, I will discuss her care with the neuro hospitalist  regarding changing or adding medications or just encouraging 4 times a day dosing.  12:31 PM D/w Dr. Aram Beecham - agrees with QID dosing - no other changes.  1:16 PM  D/w pt - labs and imaging neg stable for d/c.  Noemi Chapel, MD 02/26/15 1316

## 2015-02-26 NOTE — ED Notes (Signed)
Pt c/o increased sob without exertion since yesterday, denies any new swelling to arms or legs at this time, denies any cp. Pt states recent travel to [place] and was unable to take daily meds for Myasthenia Gravis at scheduled time, NAD noted. Airway intact. Pt axo 4.

## 2015-03-06 ENCOUNTER — Ambulatory Visit (INDEPENDENT_AMBULATORY_CARE_PROVIDER_SITE_OTHER): Payer: BLUE CROSS/BLUE SHIELD | Admitting: Neurology

## 2015-03-06 ENCOUNTER — Encounter: Payer: Self-pay | Admitting: Neurology

## 2015-03-06 VITALS — BP 115/81 | HR 89 | Ht 68.0 in | Wt 156.0 lb

## 2015-03-06 DIAGNOSIS — G7000 Myasthenia gravis without (acute) exacerbation: Secondary | ICD-10-CM

## 2015-03-06 DIAGNOSIS — R0609 Other forms of dyspnea: Secondary | ICD-10-CM | POA: Diagnosis not present

## 2015-03-06 DIAGNOSIS — R0600 Dyspnea, unspecified: Secondary | ICD-10-CM

## 2015-03-06 DIAGNOSIS — M6281 Muscle weakness (generalized): Secondary | ICD-10-CM | POA: Diagnosis not present

## 2015-03-06 NOTE — Progress Notes (Signed)
Reason for visit: Myasthenia gravis  Referring physician: Dr. Di Kindle is a 54 y.o. female  History of present illness:  Ms. Ellzey is a 54 year old right-handed white female with a history of rheumatoid arthritis affecting the hands and feet. The patient is on Plaquenil for this, and she has been on this for number of years. The patient has had a several year history of the sensation of weakness in arms and legs. She underwent an EMG and nerve conduction study evaluation through our office in September 2013, the study was relatively unremarkable, without evidence of a neuropathy or a cervical radiculopathy. Both arms were studied. The patient has developed some shortness of breath, dyspnea on exertion within the last year or so. The patient has also noted some ptosis with the eyes, and occasional double vision. The patient eventually underwent blood work that shows a minimal elevation the acetylcholine receptor antibody, and the diagnosis of myasthenia gravis was made. CK levels have been checked in January 2016, with a level of almost 500. The patient did undergo CT scan of the chest that did not show a thymoma, but patient has pulmonary nodules, the significance of this is not clear. The patient reports some occasional issues with dysphagia, no changes in speech. She denies any significant weakness of the legs, she does not have any issues with climbing up and down stairs. She was placed on Mestinon taking 60 mg 3 times daily which was associated with frequency of bowel movements, but this did help her ptosis. She has gone up to 1 tablet 4 times daily as she feels that she is getting weaker. The patient was seen previously by Dr. Tomi Likens, but she has requested to come to this office for further management. She is not on steroids at this time.  Past Medical History  Diagnosis Date  . Endometriosis   . Rheumatoid arthritis   . Fibromyalgia   . Hypothyroid   . Myasthenia gravis      Past Surgical History  Procedure Laterality Date  . Foot surgery    . Tonsilectomy/adenoidectomy with myringotomy    . Femoral hernia repair      Family History  Problem Relation Age of Onset  . Hypertension Mother   . Asthma Mother   . Hypertension Father   . Stroke Father   . HIV Brother   . Thyroid disease Daughter   . Stroke Paternal Grandmother   . COPD Paternal Grandfather     Social history:  reports that she has never smoked. She has never used smokeless tobacco. She reports that she drinks alcohol. She reports that she does not use illicit drugs.  Medications:  Prior to Admission medications   Medication Sig Start Date End Date Taking? Authorizing Provider  b complex vitamins tablet Take 1 tablet by mouth daily.   Yes Historical Provider, MD  hydroxychloroquine (PLAQUENIL) 200 MG tablet Take 200 mg by mouth 2 (two) times daily.   Yes Historical Provider, MD  levothyroxine (SYNTHROID, LEVOTHROID) 125 MCG tablet Take 125 mcg by mouth daily before breakfast.   Yes Historical Provider, MD  Multiple Vitamins-Minerals (MULTIVITAMIN PO) Take by mouth.   Yes Historical Provider, MD  naproxen (NAPROSYN) 500 MG tablet Take 500 mg by mouth 2 (two) times daily with a meal.   Yes Historical Provider, MD  Omega-3 Fatty Acids (FISH OIL PO) Take 1 capsule by mouth daily.   Yes Historical Provider, MD  pyridostigmine (MESTINON) 60 MG tablet Take 1  tablet (60 mg total) by mouth 4 (four) times daily. 02/26/15  Yes Noemi Chapel, MD      Allergies  Allergen Reactions  . Other Anaphylaxis    Shell fish   . Augmentin [Amoxicillin-Pot Clavulanate] Diarrhea  . Erythromycin Nausea And Vomiting  . Flagyl [Metronidazole]     fainting  . Indocin [Indomethacin]     Headache   . Sulfa Antibiotics     ROS:  Out of a complete 14 system review of symptoms, the patient complains only of the following symptoms, and all other reviewed systems are negative.  Fatigue Difficulty  swallowing Itching Shortness of breath Flushing Achy muscles Frequent infections Numbness, weakness, difficulty swallowing, dizziness, slurred speech Not enough sleep  Blood pressure 115/81, pulse 89, height 5\' 8"  (1.727 m), weight 156 lb (70.761 kg).  Physical Exam  General: The patient is alert and cooperative at the time of the examination.  Eyes: Pupils are equal, round, and reactive to light. Discs are flat bilaterally.  Neck: The neck is supple, no carotid bruits are noted.  Respiratory: The respiratory examination is clear.  Cardiovascular: The cardiovascular examination reveals a regular rate and rhythm, no obvious murmurs or rubs are noted.  Skin: Extremities are without significant edema.  Neurologic Exam  Mental status: The patient is alert and oriented x 3 at the time of the examination. The patient has apparent normal recent and remote memory, with an apparently normal attention span and concentration ability.  Cranial nerves: Facial symmetry is present. There is good sensation of the face to pinprick and soft touch bilaterally. The strength of the facial muscles and the muscles to head turning and shoulder shrug are normal bilaterally. Neck flexion and extension is of normal strength. There is no weakness with jaw opening and closure. Speech is well enunciated, no aphasia or dysarthria is noted. Extraocular movements are full. Visual fields are full. The tongue is midline, and the patient has symmetric elevation of the soft palate. No obvious hearing deficits are noted. With superior gaze for 1 minute, the patient reports subjective double vision after 20 seconds. No observable divergence of gaze is seen. Minimal increase in ptosis of the left eye is noted after 1 minute.  Motor: The motor testing reveals 5 over 5 strength of all 4 extremities, with exception that there is 4/5 strength in the triceps bilaterally, trace weakness in the deltoids and external rotation of the  arms bilaterally. Good symmetric motor tone is noted throughout. With arms outstretched for 1 minute, no fatigable weakness of the deltoid muscles was noted.  Sensory: Sensory testing is intact to pinprick, soft touch, vibration sensation, and position sense on all 4 extremities. No evidence of extinction is noted.  Coordination: Cerebellar testing reveals good finger-nose-finger and heel-to-shin bilaterally.  Gait and station: Gait is normal. Tandem gait is normal. Romberg is negative. No drift is seen.  Reflexes: Deep tendon reflexes are symmetric and normal bilaterally. Toes are downgoing bilaterally.   EMG/NCV study 08/26/12:  Conclusions   Nerve conduction studies done on both upper extremities were within normal limits bilaterally. There is no evidence of a carpal tunnel syndrome on either side. EMG evaluation of the left upper extremity was unremarkable. No evidence of a left cervical radiculopathy is seen. EMG evaluation of the right upper extremity shows minimal isolated denervation of the right biceps muscle. The clinical significance of this is unclear, and a right cervical radiculopathy cannot be confirmed on this study.   Assessment/Plan:  1. Myasthenia gravis  2. Rheumatoid arthritis  3. Dyspnea on exertion, pulmonary nodules  4. CK enzyme elevation  The patient likely does have myasthenia gravis, with primarily ocular features. However, the patient does have some weakness in the triceps muscles bilaterally, and elevation of the CK enzyme. This may not be related to myasthenia. The patient reports dyspnea on exertion, and she has pulmonary nodules, a pulmonary consult is warranted. The patient will continue the Mestinon at this time, she may need to go on low-dose prednisone in the future. If the CK enzyme elevation remains, a repeat EMG and nerve conduction study will be done. The patient otherwise will follow-up in about 4 months.  Jill Alexanders MD 03/06/2015 7:00  PM  [facility] [address] [place], [place] [address]  Phone [phone] Fax [phone]

## 2015-03-06 NOTE — Patient Instructions (Signed)
Myasthenia Gravis Myasthenia gravis is a disease that causes muscle weakness throughout the body. The muscles affected are the ones we can control (voluntary muscles). An example of a voluntary muscle is your hand muscles. You can control the muscles to make the hand pick something up. An example of an involuntary muscle is the heart. The heart beats without any direction from you.  Myasthenia Gravis is thought to be an autoimmune disease. That means that normal defenses of the body begin to attack the body. In this case, the immune system begins to attack cells located at the junctions of the muscles and the nerves. Women are affected more often. Women are affected at a younger age than men. Babies born to affected women frequently develop symptoms at an early age. SYMPTOMS Initially in the disease, the facial muscles are affected first. After this, a person may develop droopy eyelids. They may have difficulty controlling facial muscles. They may have problems chewing. Swallowing and speaking may become impaired. The weakness gradually spreads to the arms and legs. It begins to affect breathing. Sometimes, the symptoms lessen or go away without any apparent cause. DIAGNOSIS  Diagnosis can be made with blood tests. Tests such as electromyography may be done to examine the electrical activity in the muscle. An improvement in symptoms after having an anti-cholinesterase drug helps confirm the diagnosis.  TREATMENT  Medicines are usually prescribed as the first treatment. These medicines help, but they do not cure the disease. A plasma cleansing procedure (plasmapheresis) can be used to treat a crisis. It can also be used to prepare a person for surgery. This procedure produces short-term improvement. Some cases are helped by removing the thymus gland. Steroids are used for short-term benefits. Document Released: 03/10/2001 Document Revised: 02/24/2012 Document Reviewed: 02/02/2014 [employer] Patient  Information 2015 [employer]. This information is not intended to replace advice given to you by your health care provider. Make sure you discuss any questions you have with your health care provider.  

## 2015-03-09 ENCOUNTER — Telehealth: Payer: Self-pay | Admitting: Neurology

## 2015-03-09 DIAGNOSIS — M6281 Muscle weakness (generalized): Secondary | ICD-10-CM

## 2015-03-09 LAB — COMPREHENSIVE METABOLIC PANEL
A/G RATIO: 1.9 (ref 1.1–2.5)
ALBUMIN: 4.5 g/dL (ref 3.5–5.5)
ALT: 26 IU/L (ref 0–32)
AST: 26 IU/L (ref 0–40)
Alkaline Phosphatase: 60 IU/L (ref 39–117)
BUN/Creatinine Ratio: 43 — ABNORMAL HIGH (ref 9–23)
BUN: 26 mg/dL — AB (ref 6–24)
Bilirubin Total: 0.3 mg/dL (ref 0.0–1.2)
CALCIUM: 9.7 mg/dL (ref 8.7–10.2)
CO2: 26 mmol/L (ref 18–29)
CREATININE: 0.6 mg/dL (ref 0.57–1.00)
Chloride: 101 mmol/L (ref 97–108)
GFR calc Af Amer: 119 mL/min/{1.73_m2} (ref >59)
GFR calc non Af Amer: 103 mL/min/{1.73_m2} (ref >59)
GLUCOSE: 81 mg/dL (ref 65–99)
Globulin, Total: 2.4 g/dL (ref 1.5–4.5)
Potassium: 4.8 mmol/L (ref 3.5–5.2)
SODIUM: 142 mmol/L (ref 134–144)
TOTAL PROTEIN: 6.9 g/dL (ref 6.0–8.5)

## 2015-03-09 LAB — CK: Total CK: 510 U/L (ref 24–173)

## 2015-03-09 LAB — ANGIOTENSIN CONVERTING ENZYME: Angio Convert Enzyme: 66 U/L (ref 14–82)

## 2015-03-09 LAB — VGCC ANTIBODY: VGCC Antibody: NEGATIVE

## 2015-03-09 NOTE — Telephone Encounter (Signed)
I called patient. Blood work shows a persistent elevation in the muscle enzyme level greater than 500. I will set her up for an EMG and nerve conduction study to evaluate her for a myopathic process that may explain the muscle enzyme elevation.

## 2015-03-24 ENCOUNTER — Encounter: Payer: Self-pay | Admitting: Internal Medicine

## 2015-03-24 ENCOUNTER — Ambulatory Visit (INDEPENDENT_AMBULATORY_CARE_PROVIDER_SITE_OTHER): Payer: BLUE CROSS/BLUE SHIELD | Admitting: Internal Medicine

## 2015-03-24 VITALS — BP 122/70 | HR 79 | Ht 68.0 in | Wt 154.4 lb

## 2015-03-24 DIAGNOSIS — R0600 Dyspnea, unspecified: Secondary | ICD-10-CM

## 2015-03-24 DIAGNOSIS — R05 Cough: Secondary | ICD-10-CM | POA: Diagnosis not present

## 2015-03-24 DIAGNOSIS — R918 Other nonspecific abnormal finding of lung field: Secondary | ICD-10-CM

## 2015-03-24 DIAGNOSIS — R058 Other specified cough: Secondary | ICD-10-CM

## 2015-03-24 NOTE — Patient Instructions (Addendum)
You need a CT chest without contrast (and a sinus limited) - placed in recall file for 02/20/16 - call sooner if new symptoms  Try netti pot and see Dr Ernesto Rutherford if not improving re your sinus problems   If not happy with your breathing call back to schedule full set PFTs with MIP/MEP to measure your breathing muscle strength - call Libby at [phone] to schedule  GERD (REFLUX)  is an extremely common cause of respiratory symptoms just like yours , many times with no obvious heartburn at all.    It can be treated with medication, but also with lifestyle changes including elevation of head of bed avoidance of late meals, excessive alcohol, smoking cessation, and avoid fatty foods, chocolate, peppermint, colas, red wine, and acidic juices such as orange juice.  NO MINT OR MENTHOL PRODUCTS SO NO COUGH DROPS  USE SUGARLESS CANDY INSTEAD (Jolley ranchers or Stover's or Life Savers) or even ice chips will also do - the key is to swallow to prevent all throat clearing. NO OIL BASED VITAMINS - use powdered substitutes.   If continue to have night time problems add Pepcid 20 mg at bedtime

## 2015-03-24 NOTE — Progress Notes (Signed)
Subjective:    Patient ID: Angel Roberts, female    DOB: Oct 10, 1961,     MRN: [id number]  HPI  11 yowf never regular smoker with tendency to sinus infections as long as she can remember eval by [facility] (neg per pt but worse in pollen season) with MG symtpoms starting around 2014 confirmed by Dr Jannifer Franklin 02/2015 referred 03/24/2015  to pulmonary for eval of abn CT chest.   03/24/2015 1st [facility] office visit/ Angel Roberts   Chief Complaint  Patient presents with  . Pulmonary Consult    Referred by Dr. Margette Fast. Pt c/o chest pressure and SOB with bending over for the past several months. She states that she has had multiple sinus infections in the past, and feels that she may have one now- c/o non prod cough, facial pressure, fatigue, and teeth pain.   teeth hurting top and bottom, L > R not worse in am, occ discolored nasal drainage  when bends over at waste / not using netti pot any more  Not limited by breathing from desired activities and says overall improving ex tol as Dr Jannifer Franklin has been adjusting her MG meds  Some noct cough/ mostly dry.   No obvious patterns day to day or daytime variabilty or assoc  cp or chest tightness, subjective wheeze or overt   hb symptoms. No unusual exp hx or h/o childhood pna/ asthma or knowledge of premature birth.  No am exacerbation  of respiratory  c/o's or need for noct saba. Also denies any obvious fluctuation of symptoms with weather or environmental changes or other aggravating or alleviating factors except as outlined above   Current Medications, Allergies, Complete Past Medical History, Past Surgical History, Family History, and Social History were reviewed in [employer] record.           Review of Systems  Constitutional: Negative for fever, chills and unexpected weight change.  HENT: Positive for postnasal drip, sinus pressure and trouble swallowing. Negative for congestion, dental problem, ear pain,  nosebleeds, rhinorrhea, sneezing, sore throat and voice change.   Eyes: Negative for visual disturbance.  Respiratory: Positive for cough and shortness of breath. Negative for choking.   Cardiovascular: Negative for chest pain and leg swelling.  Gastrointestinal: Negative for vomiting, abdominal pain and diarrhea.  Genitourinary: Negative for difficulty urinating.  Musculoskeletal: Negative for arthralgias.  Skin: Negative for rash.  Neurological: Positive for headaches. Negative for tremors and syncope.  Hematological: Does not bruise/bleed easily.       Objective:   Physical Exam amb wf nad  Wt Readings from Last 3 Encounters:  03/24/15 154 lb 6.4 oz (70.035 kg)  03/06/15 156 lb (70.761 kg)  02/26/15 154 lb (69.854 kg)    Vital signs reviewed  HEENT: nl dentition, turbinates, and orophanx. Nl external ear canals without cough reflex   NECK :  without JVD/Nodes/TM/ nl carotid upstrokes bilaterally   LUNGS: no acc muscle use, clear to A and P bilaterally without cough on insp or exp maneuvers   CV:  RRR  no s3 or murmur or increase in P2, no edema   ABD:  soft and nontender with nl excursion in the supine position. No bruits or organomegaly, bowel sounds nl  MS:  warm without deformities, calf tenderness, cyanosis or clubbing  SKIN: warm and dry without lesions    NEURO:  alert, approp, no deficits   I personally reviewed images and agree with radiology impression as follows:  CT chest  s contrast:  02/20/15  Bilateral lower lobe subpleural nodules as described above. If the patient is at high risk for bronchogenic carcinoma, follow-up chest CT at 6-12 months is recommended. If the patient is at low risk for bronchogenic carcinoma, follow-up chest CT at 12 months is recommended. This recommendation follows the consensus statement: Guidelines for Management of Small Pulmonary Nodules Detected on CT Scans: A Statement from the [place] as published  in Radiology 2005;237:395-400.  No evidence of thymoma or anterior mediastinal mass.           Assessment & Plan:

## 2015-03-25 ENCOUNTER — Encounter: Payer: Self-pay | Admitting: Internal Medicine

## 2015-03-25 DIAGNOSIS — R0600 Dyspnea, unspecified: Secondary | ICD-10-CM | POA: Insufficient documentation

## 2015-03-25 DIAGNOSIS — R918 Other nonspecific abnormal finding of lung field: Secondary | ICD-10-CM | POA: Insufficient documentation

## 2015-03-25 DIAGNOSIS — R058 Other specified cough: Secondary | ICD-10-CM | POA: Insufficient documentation

## 2015-03-25 DIAGNOSIS — R05 Cough: Secondary | ICD-10-CM | POA: Insufficient documentation

## 2015-03-25 NOTE — Assessment & Plan Note (Signed)
She is not really limited by breathing but more by fatigue and says this is improving with titration of MG meds  Once this process is completed I rec she return for full pfts with MIP and MEP measurement for baseline purposes

## 2015-03-25 NOTE — Assessment & Plan Note (Signed)
Classic Upper airway cough syndrome, so named because it's frequently impossible to sort out how much is  CR/sinusitis with freq throat clearing (which can be related to primary GERD)   vs  causing  secondary (" extra esophageal")  GERD from wide swings in gastric pressure that occur with throat clearing, often  promoting self use of mint and menthol lozenges that reduce the lower esophageal sphincter tone and exacerbate the problem further in a cyclical fashion.   These are the same pts (now being labeled as having "irritable larynx syndrome" by some cough centers) who not infrequently have a history of having failed to tolerate ace inhibitors,  dry powder inhalers or biphosphonates or report having atypical reflux symptoms that don't respond to standard doses of PPI , and are easily confused as having aecopd or asthma flares by even experienced allergists/ pulmonologists.   Relatively mild with active chronic sinusitis already being followed by Ernesto Rutherford and not using netti pot as instructed which was my first rec and f/u with Dr Ernesto Rutherford is my second if not doing well  She likely has low grade reflux as well which may explain some of her sob bending over but not with exertion. rec diet/ add pepcid at hs if still has cough with HOB elevated

## 2015-03-25 NOTE — Assessment & Plan Note (Signed)
CT results reviewed with pt >>> Too small for PET or bx, not suspicious enough for excisional bx > really only option for now is follow the Fleischner society guidelines as rec by radiologyz= f/u one year, placed in epic for recall  Discussed in detail all the  indications, usual  risks and alternatives  relative to the benefits with patient who agrees to proceed with concservative f/u as above as she is very low risk ca .

## 2015-03-29 ENCOUNTER — Telehealth: Payer: Self-pay | Admitting: Neurology

## 2015-03-29 NOTE — Telephone Encounter (Signed)
I called patient. She is having abdominal cramps. This could be a side effect of the Mestinon. The patient will cut back to taking 60 mg 3 times daily. If this is not effective, we may have to dose with half tablets more frequently.

## 2015-03-29 NOTE — Telephone Encounter (Signed)
Patient is calling because she has been having stomach pains with hurting in her lower back, right side and abdomen for the past week and a half. Patient is wondering if this could be coming from the medication pyridostigmine (MESTINON) 60 MG tablet. Please call and advise. Thank you.

## 2015-05-03 ENCOUNTER — Ambulatory Visit: Payer: BLUE CROSS/BLUE SHIELD | Admitting: Neurology

## 2015-07-12 ENCOUNTER — Telehealth: Payer: Self-pay | Admitting: Neurology

## 2015-07-12 NOTE — Telephone Encounter (Signed)
error 

## 2015-07-18 ENCOUNTER — Ambulatory Visit: Payer: BLUE CROSS/BLUE SHIELD | Admitting: Neurology

## 2015-10-05 ENCOUNTER — Ambulatory Visit (INDEPENDENT_AMBULATORY_CARE_PROVIDER_SITE_OTHER): Payer: BLUE CROSS/BLUE SHIELD | Admitting: Neurology

## 2015-10-05 ENCOUNTER — Encounter: Payer: Self-pay | Admitting: Neurology

## 2015-10-05 VITALS — BP 111/70 | HR 79 | Ht 68.0 in | Wt 157.0 lb

## 2015-10-05 DIAGNOSIS — G7000 Myasthenia gravis without (acute) exacerbation: Secondary | ICD-10-CM | POA: Diagnosis not present

## 2015-10-05 MED ORDER — PYRIDOSTIGMINE BROMIDE 60 MG PO TABS: 60.0000 mg | ORAL_TABLET | Freq: Three times a day (TID) | ORAL | Status: DC

## 2015-10-05 NOTE — Patient Instructions (Addendum)
We will check EMG and NCV study.  Myasthenia Gravis Myasthenia gravis (MG) means severe weakness. It is a long-term (chronic) condition that causes weakness in the muscles you can control (voluntary muscles). MG can affect any voluntary muscle. The muscles most often affected are the ones that control:   Eye movement.  Facial movements.  Swallowing. MG is an autoimmune disease, which means that your body's defense system (immune system) attacks healthy parts of your body instead of germs and other things that make you sick. When you have MG, your immune system makes proteins (antibodies) that block the chemical (acetylcholine) your body needs to send nerve signals to your muscles. This causes muscle weakness. CAUSES  The exact cause of MG is unknown. One possible cause is an enlarged thymus gland, which is located under your breastbone.  SIGNS AND SYMPTOMS The earliest symptom of MG is muscle weakness that gets worse with activity and gets better after rest. Other symptoms of MG may include:  Drooping eyelids.  Double vision.  Loss of facial expression.  Trouble chewing and swallowing.  Slurred speech.  A waddling walk.  Weakness of the arms, hands, and legs. Trouble breathing is the most dangerous symptom of MG. Sudden and severe difficulty breathing (myasthenic crisis) may require emergency breathing support. This symptom sometimes happens after:   Infection.  Fever.  Drug reaction. DIAGNOSIS  It can be hard to diagnose MG because muscle weakness is a common symptom in many conditions. Your health care provider will do a physical exam. You may also have tests that will help make a diagnosis. These may include:  A blood test.  A test using the medicine edrophonium. This medicine increases muscle strength by slowing the breakdown of acetylcholine.  Tests to measure nerve conduction to muscle (electromyography).  An imaging study of the chest (CT or MRI). TREATMENT   Treatment can improve muscle strength. Sometimes symptoms of MG go away for a while (remission) and you can stop treatment. Possible treatments include:  Medicine.  Removal of the thymus gland (thymectomy). This may result in a long remission for some people. HOME CARE INSTRUCTIONS  Take medicines only as directed by your health care provider.  Get plenty of rest to conserve your energy.  Take frequent breaks to rest your eyes.  Maintain a healthy diet and a healthy weight.  Do not use any tobacco products including cigarettes, chewing tobacco, or electronic cigarettes. If you need help quitting, ask your health care provider.  Keep all follow-up visits as directed by your health care provider. This is important. SEEK MEDICAL CARE IF:  Your symptoms get worse after a fever or infection.  You have a reaction to a medicine you are taking.  Your symptoms change or get worse. SEEK IMMEDIATE MEDICAL CARE IF: You have trouble breathing.    This information is not intended to replace advice given to you by your health care provider. Make sure you discuss any questions you have with your health care provider.   Document Released: 03/10/2001 Document Revised: 12/23/2014 Document Reviewed: 02/02/2014 Elsevier Interactive Patient Education [employer].

## 2015-10-05 NOTE — Progress Notes (Signed)
Reason for visit: Myasthenia gravis  Angel Roberts is an 54 y.o. female  History of present illness:  Angel Roberts is a 54 year old right-handed white female with a history of rheumatoid arthritis. The patient presented with droopy eyelids, double vision, found to have a low elevation of the acetylcholine receptor antibody, the diagnosis of myasthenia gravis was made. The patient however, has had persistent low-grade elevations in CK enzymes, has bilateral triceps weakness. The patient was set up for EMG and nerve conduction study evaluation on her last visit, but this apparently was never done. The patient has had some pulmonary nodules, she is followed by pulmonology. She believes that her shortness of breath has improved over time. She denies any significant issues with ptosis or double vision, this is an occasional problem for her. She occasionally will have some troubles with swallowing. She denies any issues with chewing. She reports that her ability to perform physical activity is actually improved over time. She does have some discomfort across the low back, right greater than left, and some sensation of numbness down the right leg that is persistent since she had foot surgery 8 years ago. The patient returns for an evaluation today.  Past Medical History  Diagnosis Date  . Endometriosis   . Rheumatoid arthritis ([place])   . Fibromyalgia   . Hypothyroid   . Myasthenia gravis [facility])     Past Surgical History  Procedure Laterality Date  . Foot surgery    . Tonsilectomy/adenoidectomy with myringotomy    . Femoral hernia repair      Family History  Problem Relation Age of Onset  . Hypertension Mother   . Asthma Mother   . Hypertension Father   . Stroke Father   . HIV Brother   . Thyroid disease Daughter   . Stroke Paternal Grandmother   . COPD Paternal Grandfather   . Asthma Son     Social history:  reports that she has never smoked. She has never used smokeless tobacco.  She reports that she drinks alcohol. She reports that she does not use illicit drugs.    Allergies  Allergen Reactions  . Other Anaphylaxis    Shell fish   . Augmentin [Amoxicillin-Pot Clavulanate] Diarrhea  . Erythromycin Nausea And Vomiting  . Flagyl [Metronidazole]     fainting  . Indocin [Indomethacin]     Headache   . Sulfa Antibiotics     Medications:  Prior to Admission medications   Medication Sig Start Date End Date Taking? Authorizing Provider  hydroxychloroquine (PLAQUENIL) 200 MG tablet Take 200 mg by mouth 2 (two) times daily.   Yes Historical Provider, MD  levothyroxine (SYNTHROID, LEVOTHROID) 125 MCG tablet Take 125 mcg by mouth daily before breakfast.   Yes Historical Provider, MD  Multiple Vitamins-Minerals (MULTIVITAMIN PO) Take by mouth.   Yes Historical Provider, MD  naproxen (NAPROSYN) 500 MG tablet Take 500 mg by mouth 2 (two) times daily with a meal.   Yes Historical Provider, MD  Probiotic CAPS Take 1 capsule by mouth daily.   Yes Historical Provider, MD  pyridostigmine (MESTINON) 60 MG tablet Take 60 mg by mouth 3 (three) times daily.   Yes Historical Provider, MD    ROS:  Out of a complete 14 system review of symptoms, the patient complains only of the following symptoms, and all other reviewed systems are negative.  Arm weakness Double vision  Blood pressure 111/70, pulse 79, height 5\' 8"  (2.952 m), weight 157 lb (71.215 kg).  Physical Exam  General: The patient is alert and cooperative at the time of the examination.  Skin: No significant peripheral edema is noted.   Neurologic Exam  Mental status: The patient is alert and oriented x 3 at the time of the examination. The patient has apparent normal recent and remote memory, with an apparently normal attention span and concentration ability.   Cranial nerves: Facial symmetry is present. Speech is normal, no aphasia or dysarthria is noted. Extraocular movements are full. Visual fields are  full.  Motor: The patient has good strength in all 4 extremities, with exception of 4/5 strength in the triceps muscles bilaterally, mild weakness with external rotation of the shoulders bilaterally.  Sensory examination: Soft touch sensation is symmetric on the face, arms, and legs, with exception of some decrease in soft touch sensation on the right leg relative to the left.  Coordination: The patient has good finger-nose-finger and heel-to-shin bilaterally.  Gait and station: The patient has a normal gait. Tandem gait is normal. Romberg is negative. No drift is seen. The patient is able to arise from seated position with arms crossed.  Reflexes: Deep tendon reflexes are symmetric.   Assessment/Plan:  1. Myasthenia gravis, ocular features  2. Rheumatoid arthritis  3. Persistent CK enzyme elevation  The etiology of the CK enzyme elevation is not clear. The patient does have bilateral triceps weakness. The patient has not had any decline in her physical abilities since last seen. We will need to evaluate the patient for a possible low-grade inflammatory myopathy. The patient will be set up for nerve conduction studies of both arms, right leg. The EMG evaluation will be done on the right leg and right arm. The patient will maintain on Mestinon for now, we may consider low-dose prednisone in the future.  Jill Alexanders MD 10/05/2015 9:00 PM  [facility] [address] [place], [place] [address]  Phone [phone] Fax [phone]

## 2015-11-13 ENCOUNTER — Other Ambulatory Visit: Payer: Self-pay | Admitting: Gastroenterology

## 2015-11-14 ENCOUNTER — Encounter: Payer: BLUE CROSS/BLUE SHIELD | Admitting: Neurology

## 2015-12-14 ENCOUNTER — Other Ambulatory Visit: Payer: Self-pay | Admitting: Gastroenterology

## 2015-12-14 DIAGNOSIS — R1033 Periumbilical pain: Secondary | ICD-10-CM

## 2015-12-19 ENCOUNTER — Ambulatory Visit
Admission: RE | Admit: 2015-12-19 | Discharge: 2015-12-19 | Disposition: A | Discharge status: Home / Self Care | Payer: BLUE CROSS/BLUE SHIELD | Source: Ambulatory Visit | Attending: Gastroenterology | Admitting: Gastroenterology

## 2015-12-19 DIAGNOSIS — R1033 Periumbilical pain: Secondary | ICD-10-CM

## 2015-12-19 MED ORDER — IOPAMIDOL (ISOVUE-300) INJECTION 61%
100.0000 mL | Freq: Once | INTRAVENOUS | Status: AC | PRN
  Administered 2015-12-19: 100 mL via INTRAVENOUS

## 2015-12-25 ENCOUNTER — Encounter: Payer: BLUE CROSS/BLUE SHIELD | Admitting: Neurology

## 2015-12-27 ENCOUNTER — Ambulatory Visit (INDEPENDENT_AMBULATORY_CARE_PROVIDER_SITE_OTHER): Payer: Self-pay | Admitting: Neurology

## 2015-12-27 ENCOUNTER — Encounter: Payer: Self-pay | Admitting: Neurology

## 2015-12-27 ENCOUNTER — Ambulatory Visit (INDEPENDENT_AMBULATORY_CARE_PROVIDER_SITE_OTHER): Payer: BLUE CROSS/BLUE SHIELD | Admitting: Neurology

## 2015-12-27 DIAGNOSIS — R29898 Other symptoms and signs involving the musculoskeletal system: Secondary | ICD-10-CM

## 2015-12-27 DIAGNOSIS — Z0289 Encounter for other administrative examinations: Secondary | ICD-10-CM

## 2015-12-27 DIAGNOSIS — G7000 Myasthenia gravis without (acute) exacerbation: Secondary | ICD-10-CM

## 2015-12-27 NOTE — Progress Notes (Signed)
Angel Roberts is a 55 year old patient with a history of myasthenia gravis and rheumatoid arthritis. She has been noted to have elevations in CK enzyme levels in the 400-500 range. The patient is on Plaquenil on a chronic basis. She is being evaluated for the CK enzyme elevation. She has been noted to have bilateral arm weakness, primarily in the triceps muscles.  Nerve conduction studies on both arms and the right leg were unremarkable, no evidence of a neuropathy is seen. EMG evaluation of the right upper extremity shows very minimal myopathic changes in the right triceps muscle, possibly involving the right biceps muscle as well. No evidence of neuropathic denervation is seen.  The patient could have a very low-grade myopathic process, possibly associated with the use of Plaquenil chronically. We will need to follow the level of weakness over time. The patient indicates that she recently had blood work done through her primary care doctor, she was told that the muscle enzyme levels had improved. I do not have the report of this study.

## 2015-12-27 NOTE — Procedures (Signed)
     HISTORY:  Angel Roberts is a 55 year old patient with a history of rheumatoid arthritis and a history of myasthenia gravis. She has had persistent mild elevations of CK enzymes on blood work. She has bilateral arm weakness, primarily in the triceps distribution. The patient is being evaluated for this issue. The patient has been on Plaquenil for her rheumatoid arthritis.  NERVE CONDUCTION STUDIES:  Nerve conduction studies were performed on both upper extremities. The distal motor latencies and motor amplitudes for the median and ulnar nerves were within normal limits. The F wave latencies and nerve conduction velocities for these nerves were also normal. The sensory latencies for the median and ulnar nerves were normal.  Nerve conduction studies were performed on the right lower extremity. The distal motor latencies and motor amplitudes for the peroneal and posterior tibial nerves were within normal limits. The nerve conduction velocities for these nerves were also normal. The sensory latency for the peroneal nerve was within normal limits.   EMG STUDIES:  EMG study was performed on the right upper extremity:  The first dorsal interosseous muscle reveals 2 to 4 K units with full recruitment. No fibrillations or positive waves were noted. The abductor pollicis brevis muscle reveals 2 to 4 K units with full recruitment. No fibrillations or positive waves were noted. The extensor indicis proprius muscle reveals 1 to 3 K units with full recruitment. No fibrillations or positive waves were noted. The pronator teres muscle reveals 2 to 3 K units with full recruitment. No fibrillations or positive waves were noted. The biceps muscle reveals 1 to 2 K units with full recruitment. No fibrillations or positive waves were noted. The triceps muscle reveals 1 to 3 K units with full recruitment. 1+ positive waves were noted. The anterior deltoid muscle reveals 2 to 3 K units with full recruitment. No  fibrillations or positive waves were noted. The cervical paraspinal muscles were tested at 2 levels. One run of positive waves were seen in the lower cervical paraspinal muscles, the upper level was unremarkable. There was good relaxation.   IMPRESSION:  Nerve conduction studies done on both upper extremities and on the right lower extremity were unremarkable. No evidence of a neuropathy is seen. EMG of the right upper extremity shows minimal changes in the right triceps muscle consistent with some myopathic changes, there may be some minimal changes as well in the right biceps muscle. No evidence of an overlying cervical radiculopathy is seen.  Jill Alexanders MD 12/27/2015 5:17 PM  [facility] [address] [place], [place] [address]  Phone [phone] Fax [phone]

## 2015-12-27 NOTE — Progress Notes (Signed)
Please refer to EMG and nerve conduction study procedure note. 

## 2016-01-23 IMAGING — CR DG CHEST 1V PORT
1 series · 1 of 1 positions shown · non-contrast
Comparison: 02/20/2015

CLINICAL DATA: Shortness of breath and weakness

EXAM:
PORTABLE CHEST - 1 VIEW

[AP]
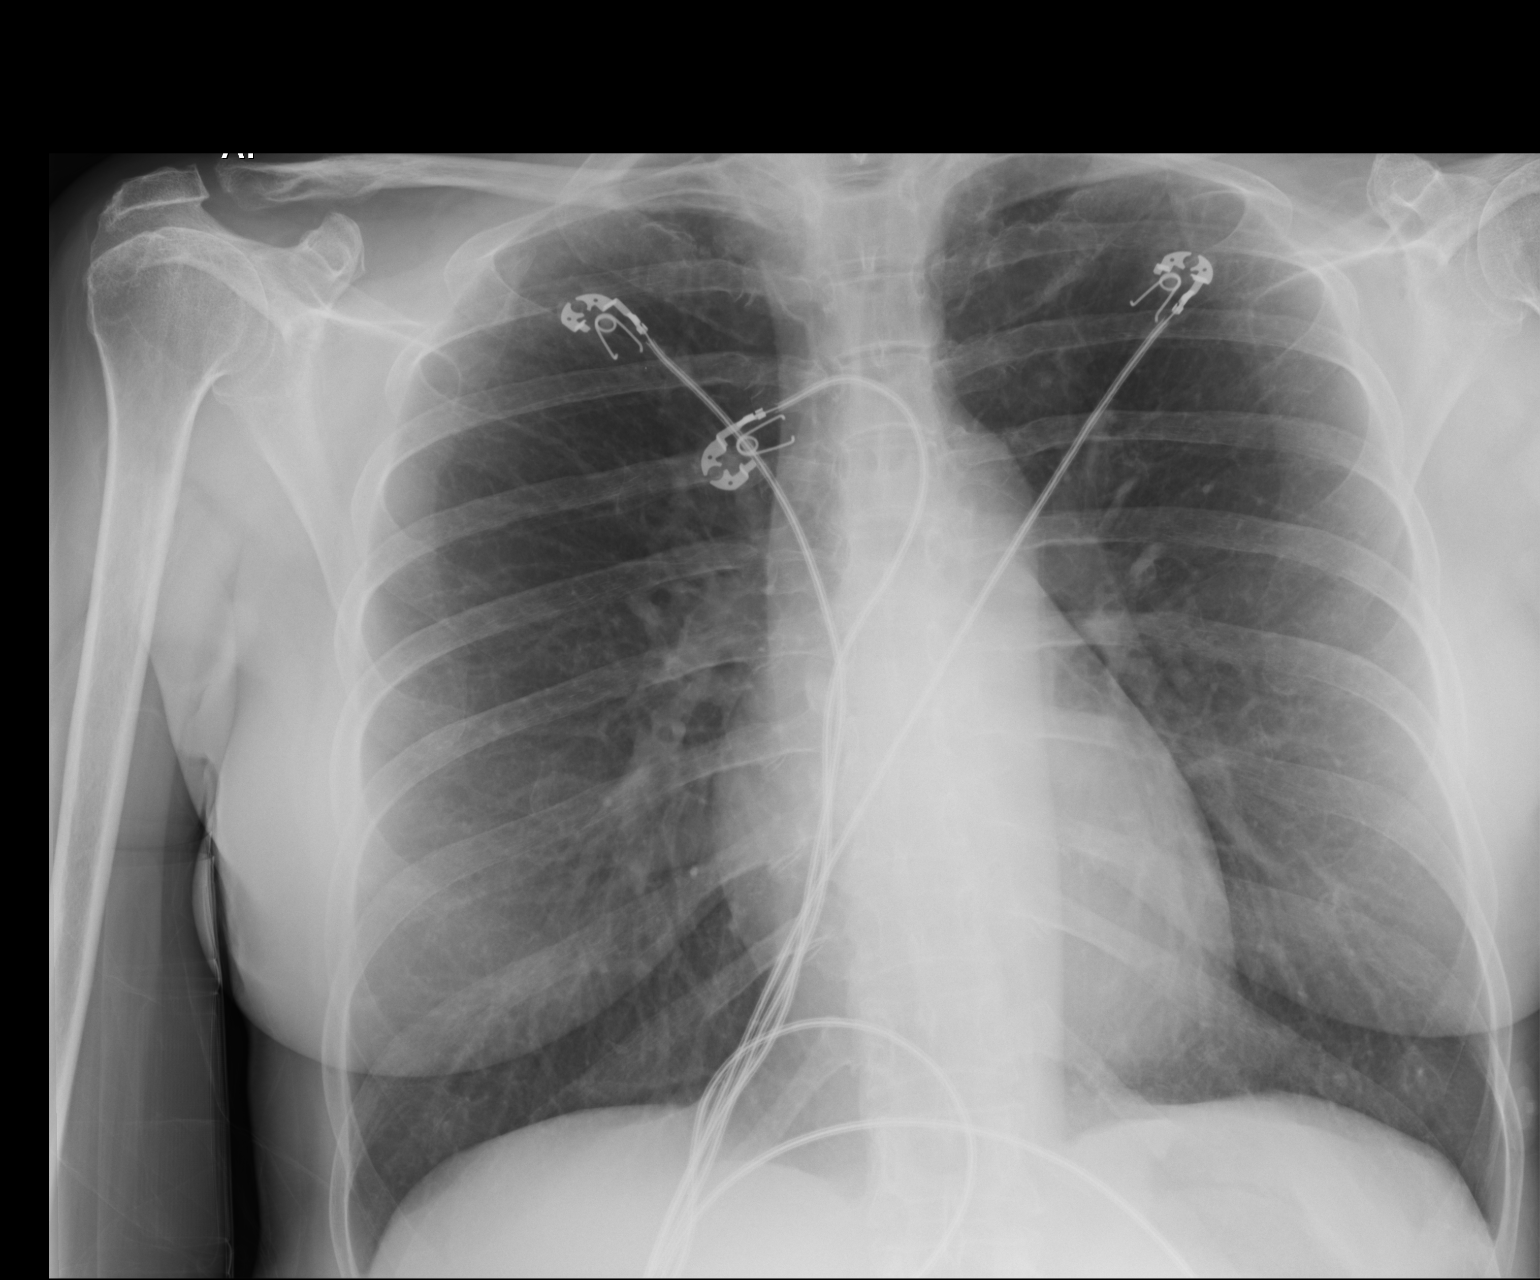

[1 of 1 positions shown; findings below may reference images not displayed]

FINDINGS: The heart size and mediastinal contours are within normal limits.
Both lungs are clear. The visualized skeletal structures are
unremarkable.
IMPRESSION: No active disease.

## 2016-01-25 ENCOUNTER — Encounter: Payer: BLUE CROSS/BLUE SHIELD | Admitting: Neurology

## 2016-02-16 ENCOUNTER — Telehealth: Payer: Self-pay | Admitting: *Deleted

## 2016-02-16 DIAGNOSIS — R918 Other nonspecific abnormal finding of lung field: Secondary | ICD-10-CM

## 2016-02-19 NOTE — Telephone Encounter (Signed)
Order sent to [facility] for ct chest   

## 2016-02-26 ENCOUNTER — Ambulatory Visit (INDEPENDENT_AMBULATORY_CARE_PROVIDER_SITE_OTHER)
Admission: RE | Admit: 2016-02-26 | Discharge: 2016-02-26 | Disposition: A | Discharge status: Home / Self Care | Payer: BLUE CROSS/BLUE SHIELD | Source: Ambulatory Visit | Attending: Internal Medicine | Admitting: Internal Medicine

## 2016-02-26 DIAGNOSIS — R918 Other nonspecific abnormal finding of lung field: Secondary | ICD-10-CM | POA: Diagnosis not present

## 2016-02-26 NOTE — Progress Notes (Signed)
Quick Note:  Spoke with pt and notified of results per Dr. Wert. Pt verbalized understanding and denied any questions.  ______ 

## 2016-03-20 DIAGNOSIS — Z01419 Encounter for gynecological examination (general) (routine) without abnormal findings: Secondary | ICD-10-CM | POA: Diagnosis not present

## 2016-03-20 DIAGNOSIS — Z13820 Encounter for screening for osteoporosis: Secondary | ICD-10-CM | POA: Diagnosis not present

## 2016-03-20 DIAGNOSIS — Z6822 Body mass index (BMI) 22.0-22.9, adult: Secondary | ICD-10-CM | POA: Diagnosis not present

## 2016-03-20 DIAGNOSIS — Z1231 Encounter for screening mammogram for malignant neoplasm of breast: Secondary | ICD-10-CM | POA: Diagnosis not present

## 2016-04-09 DIAGNOSIS — R87615 Unsatisfactory cytologic smear of cervix: Secondary | ICD-10-CM | POA: Diagnosis not present

## 2016-04-12 DIAGNOSIS — B2790 Infectious mononucleosis, unspecified without complication: Secondary | ICD-10-CM | POA: Diagnosis not present

## 2016-04-15 DIAGNOSIS — E039 Hypothyroidism, unspecified: Secondary | ICD-10-CM | POA: Diagnosis not present

## 2016-04-15 DIAGNOSIS — M0540 Rheumatoid myopathy with rheumatoid arthritis of unspecified site: Secondary | ICD-10-CM | POA: Diagnosis not present

## 2016-04-17 ENCOUNTER — Encounter: Payer: Self-pay | Admitting: Neurology

## 2016-04-17 ENCOUNTER — Ambulatory Visit (INDEPENDENT_AMBULATORY_CARE_PROVIDER_SITE_OTHER): Payer: BLUE CROSS/BLUE SHIELD | Admitting: Neurology

## 2016-04-17 VITALS — BP 104/60 | HR 84 | Ht 68.0 in | Wt 147.5 lb

## 2016-04-17 DIAGNOSIS — G7000 Myasthenia gravis without (acute) exacerbation: Secondary | ICD-10-CM

## 2016-04-17 DIAGNOSIS — R29898 Other symptoms and signs involving the musculoskeletal system: Secondary | ICD-10-CM

## 2016-04-17 NOTE — Progress Notes (Signed)
Reason for visit: Myasthenia gravis  Angel Roberts is an 55 y.o. female  History of present illness:  Angel Roberts is a 55 year old right-handed white female with a history of rheumatoid arthritis and a history of myasthenia gravis primarily with ocular features. The patient has felt that the arms have gained strength, she was told on her last blood draw that the CK levels have normalized. I do not have an actual report of this study. The patient has been on Mestinon, she has been able to cut back to 2 tablets daily on average. She will note some occasional problems with droopy eyelids when she is very tired and fatigued, but generally she does well during the day. She reports no problems with chewing or swallowing. She denies any new medical issues that have come up since last seen.  Past Medical History  Diagnosis Date  . Endometriosis   . Rheumatoid arthritis ([place])   . Fibromyalgia   . Hypothyroid   . Myasthenia gravis [facility])     Past Surgical History  Procedure Laterality Date  . Foot surgery    . Tonsilectomy/adenoidectomy with myringotomy    . Femoral hernia repair      Family History  Problem Relation Age of Onset  . Hypertension Mother   . Asthma Mother   . Hypertension Father   . Stroke Father   . HIV Brother   . Thyroid disease Daughter   . Stroke Paternal Grandmother   . COPD Paternal Grandfather   . Asthma Son     Social history:  reports that she has never smoked. She has never used smokeless tobacco. She reports that she drinks alcohol. She reports that she does not use illicit drugs.    Allergies  Allergen Reactions  . Other Anaphylaxis    Shell fish   . Augmentin [Amoxicillin-Pot Clavulanate] Diarrhea  . Erythromycin Nausea And Vomiting  . Flagyl [Metronidazole]     fainting  . Indocin [Indomethacin]     Headache   . Sulfa Antibiotics     Medications:  Prior to Admission medications   Medication Sig Start Date End Date Taking? Authorizing  Provider  hydroxychloroquine (PLAQUENIL) 200 MG tablet Take 200 mg by mouth 2 (two) times daily.   Yes Historical Provider, MD  levothyroxine (SYNTHROID, LEVOTHROID) 125 MCG tablet Take 125 mcg by mouth daily before breakfast.   Yes Historical Provider, MD  MELATONIN PO Take by mouth at bedtime as needed.   Yes Historical Provider, MD  Multiple Vitamins-Minerals (MULTIVITAMIN PO) Take by mouth.   Yes Historical Provider, MD  NALTREXONE HCL PO Take by mouth.   Yes Historical Provider, MD  naproxen (NAPROSYN) 500 MG tablet Take 500 mg by mouth 2 (two) times daily with a meal.   Yes Historical Provider, MD  Nutritional Supplements (DHEA PO) Take by mouth daily.   Yes Historical Provider, MD  Probiotic CAPS Take 1 capsule by mouth daily.   Yes Historical Provider, MD  estradiol (VIVELLE-DOT) 0.0375 MG/24HR UNW AND APP 1 PA TO SKIN Q 3 DAYS 04/04/16   Historical Provider, MD  progesterone (PROMETRIUM) 100 MG capsule TK 2 CS PO QHS 04/09/16   Historical Provider, MD  pyridostigmine (MESTINON) 60 MG tablet Take 1 tablet (60 mg total) by mouth 3 (three) times daily. 10/05/15   Kathrynn Ducking, MD    ROS:  Out of a complete 14 system review of symptoms, the patient complains only of the following symptoms, and all other reviewed systems  are negative.  Runny nose Eye discharge Frequent waking Food allergies Urinary urgency Joint pain, joint swelling Itching Anxiety  Blood pressure 104/60, pulse 84, height 5\' 8"  (1.727 m), weight 147 lb 8 oz (66.906 kg).  Physical Exam  General: The patient is alert and cooperative at the time of the examination.  Skin: No significant peripheral edema is noted.   Neurologic Exam  Mental status: The patient is alert and oriented x 3 at the time of the examination. The patient has apparent normal recent and remote memory, with an apparently normal attention span and concentration ability.   Cranial nerves: Facial symmetry is present. Speech is normal, no  aphasia or dysarthria is noted. Extraocular movements are full. Visual fields are full.  Motor: The patient has good strength in all 4 extremities, with exception of 4/5 strength with the left triceps, trace weakness on the right triceps.  Sensory examination: Soft touch sensation is symmetric on the face, arms, and legs.  Coordination: The patient has good finger-nose-finger and heel-to-shin bilaterally.  Gait and station: The patient has a normal gait. Tandem gait is normal. Romberg is negative. No drift is seen.  Reflexes: Deep tendon reflexes are symmetric.   Assessment/Plan:  1. Myasthenia gravis with ocular features  2. Bilateral arm weakness  The patient has done better with her arm strength, and she indicates that the CK enzyme elevations have improved. The patient remains on a fairly low dose of Mestinon, and she is having minimal symptoms associated with her myasthenia gravis. We will not alter any medications today, she will follow-up in 6 months, sooner if needed.  Jill Alexanders MD 04/17/2016 8:14 PM  [facility] [address] [place], [place] [address]  Phone [phone] Fax [phone]

## 2016-06-10 DIAGNOSIS — Z79899 Other long term (current) drug therapy: Secondary | ICD-10-CM | POA: Diagnosis not present

## 2016-06-10 DIAGNOSIS — M069 Rheumatoid arthritis, unspecified: Secondary | ICD-10-CM | POA: Diagnosis not present

## 2016-06-14 DIAGNOSIS — W57XXXA Bitten or stung by nonvenomous insect and other nonvenomous arthropods, initial encounter: Secondary | ICD-10-CM | POA: Diagnosis not present

## 2016-06-14 DIAGNOSIS — B2790 Infectious mononucleosis, unspecified without complication: Secondary | ICD-10-CM | POA: Diagnosis not present

## 2016-06-14 DIAGNOSIS — M359 Systemic involvement of connective tissue, unspecified: Secondary | ICD-10-CM | POA: Diagnosis not present

## 2016-07-12 ENCOUNTER — Other Ambulatory Visit: Payer: Self-pay | Admitting: Neurology

## 2016-07-31 DIAGNOSIS — Z79899 Other long term (current) drug therapy: Secondary | ICD-10-CM | POA: Diagnosis not present

## 2016-07-31 DIAGNOSIS — A6920 Lyme disease, unspecified: Secondary | ICD-10-CM | POA: Diagnosis not present

## 2016-07-31 DIAGNOSIS — M0579 Rheumatoid arthritis with rheumatoid factor of multiple sites without organ or systems involvement: Secondary | ICD-10-CM | POA: Diagnosis not present

## 2016-07-31 DIAGNOSIS — M2550 Pain in unspecified joint: Secondary | ICD-10-CM | POA: Diagnosis not present

## 2016-08-09 DIAGNOSIS — R197 Diarrhea, unspecified: Secondary | ICD-10-CM | POA: Diagnosis not present

## 2016-08-09 DIAGNOSIS — M359 Systemic involvement of connective tissue, unspecified: Secondary | ICD-10-CM | POA: Diagnosis not present

## 2016-08-09 DIAGNOSIS — W57XXXA Bitten or stung by nonvenomous insect and other nonvenomous arthropods, initial encounter: Secondary | ICD-10-CM | POA: Diagnosis not present

## 2016-08-09 DIAGNOSIS — N390 Urinary tract infection, site not specified: Secondary | ICD-10-CM | POA: Diagnosis not present

## 2016-08-13 DIAGNOSIS — M359 Systemic involvement of connective tissue, unspecified: Secondary | ICD-10-CM | POA: Diagnosis not present

## 2016-08-13 DIAGNOSIS — W57XXXA Bitten or stung by nonvenomous insect and other nonvenomous arthropods, initial encounter: Secondary | ICD-10-CM | POA: Diagnosis not present

## 2016-09-11 DIAGNOSIS — A047 Enterocolitis due to Clostridium difficile: Secondary | ICD-10-CM | POA: Diagnosis not present

## 2016-10-22 ENCOUNTER — Ambulatory Visit (INDEPENDENT_AMBULATORY_CARE_PROVIDER_SITE_OTHER): Payer: BLUE CROSS/BLUE SHIELD | Admitting: Neurology

## 2016-10-22 ENCOUNTER — Encounter: Payer: Self-pay | Admitting: Neurology

## 2016-10-22 VITALS — BP 109/65 | HR 74 | Ht 68.0 in | Wt 151.0 lb

## 2016-10-22 DIAGNOSIS — G7000 Myasthenia gravis without (acute) exacerbation: Secondary | ICD-10-CM | POA: Diagnosis not present

## 2016-10-22 NOTE — Progress Notes (Signed)
Reason for visit: Myasthenia gravis  Angel Roberts is an 55 y.o. female  History of present illness:  Angel Roberts is a 54 year old right-handed white female with a history of myasthenia gravis primarily with ocular features. The patient has done quite well. She has not had much in the way of problems with double vision or ptosis of the eyes. The patient was treated for Lyme disease in June 2017. The patient was on a 6-8 week course of antibiotics and she developed a C. difficile colitis. The patient is still having problems in this regard, she will be seen by a gastroenterologist in the near future. She has not had any weakness of the arms or legs, she denies any speech or swallowing problems. She returns for an evaluation.  Past Medical History:  Diagnosis Date  . Endometriosis   . Fibromyalgia   . Hypothyroid   . Myasthenia gravis ([place])   . Rheumatoid arthritis [facility])     Past Surgical History:  Procedure Laterality Date  . FEMORAL HERNIA REPAIR    . FOOT SURGERY    . TONSILECTOMY/ADENOIDECTOMY WITH MYRINGOTOMY      Family History  Problem Relation Age of Onset  . Hypertension Mother   . Asthma Mother   . Hypertension Father   . Stroke Father   . HIV Brother   . Thyroid disease Daughter   . Stroke Paternal Grandmother   . COPD Paternal Grandfather   . Asthma Son     Social history:  reports that she has never smoked. She has never used smokeless tobacco. She reports that she drinks alcohol. She reports that she does not use drugs.    Allergies  Allergen Reactions  . Other Anaphylaxis    Shell fish   . Augmentin [Amoxicillin-Pot Clavulanate] Diarrhea  . Erythromycin Nausea And Vomiting  . Flagyl [Metronidazole]     fainting  . Indocin [Indomethacin]     Headache   . Sulfa Antibiotics     Medications:  Prior to Admission medications   Medication Sig Start Date End Date Taking? Authorizing Provider  hydroxychloroquine (PLAQUENIL) 200 MG tablet Take 200 mg  by mouth 2 (two) times daily.   Yes Historical Provider, MD  levothyroxine (SYNTHROID, LEVOTHROID) 125 MCG tablet Take 125 mcg by mouth daily before breakfast.   Yes Historical Provider, MD  MELATONIN PO Take by mouth at bedtime as needed.   Yes Historical Provider, MD  Multiple Vitamins-Minerals (MULTIVITAMIN PO) Take by mouth.   Yes Historical Provider, MD  NALTREXONE HCL PO Take 1 tablet by mouth at bedtime.    Yes Historical Provider, MD  naproxen (NAPROSYN) 500 MG tablet Take 500 mg by mouth 2 (two) times daily with a meal.   Yes Historical Provider, MD  Nutritional Supplements (DHEA PO) Take by mouth daily.   Yes Historical Provider, MD  Probiotic CAPS Take 1 capsule by mouth daily.   Yes Historical Provider, MD  pyridostigmine (MESTINON) 60 MG tablet TAKE 1 TABLET(60 MG) BY MOUTH THREE TIMES DAILY Patient taking differently: TAKE 1 TABLET(60 MG) BY MOUTH TWO TIMES DAILY 07/12/16  Yes Kathrynn Ducking, MD    ROS:  Out of a complete 14 system review of symptoms, the patient complains only of the following symptoms, and all other reviewed systems are negative.  Fatigue Swollen abdomen, abdominal pain, diarrhea Frequent waking, daytime sleepiness Food allergies Muscle cramps Dizziness, weakness, facial drooping  Blood pressure 109/65, pulse 74, height 5\' 8"  (1.727 m), weight 151 lb (  68.5 kg).  Physical Exam  General: The patient is alert and cooperative at the time of the examination.  Skin: No significant peripheral edema is noted.   Neurologic Exam  Mental status: The patient is alert and oriented x 3 at the time of the examination. The patient has apparent normal recent and remote memory, with an apparently normal attention span and concentration ability.   Cranial nerves: Facial symmetry is present. Speech is normal, no aphasia or dysarthria is noted. Extraocular movements are full. Visual fields are full. With superior deviation of the eyes for 1 minute, no increased  ptosis is noted. The patient reports some subjective double vision in about 15 seconds, no divergence of gaze is visible.  Motor: The patient has good strength in all 4 extremities. With the arms outstretched 1 minute, no fatigable weakness of the deltoid muscles was seen.  Sensory examination: Soft touch sensation is symmetric on the face, arms, and legs.  Coordination: The patient has good finger-nose-finger and heel-to-shin bilaterally.  Gait and station: The patient has a normal gait. Tandem gait is normal. Romberg is negative. No drift is seen.  Reflexes: Deep tendon reflexes are symmetric.   Assessment/Plan:  1. Myasthenia gravis  2. Rheumatoid arthritis  The patient is doing well with her myasthenia symptoms, she is taking only Mestinon. Given the recent problem with C. difficile colitis, she may cut back on the Mestinon taking one half tablet twice daily to help improve the diarrhea. The patient otherwise will follow-up in about 6 months.  Angel Alexanders MD 10/22/2016 2:58 PM  [facility] [address] [place], [place] [address]  Phone [phone] Fax [phone]

## 2016-10-23 DIAGNOSIS — R1010 Upper abdominal pain, unspecified: Secondary | ICD-10-CM | POA: Diagnosis not present

## 2016-10-23 DIAGNOSIS — Z8619 Personal history of other infectious and parasitic diseases: Secondary | ICD-10-CM | POA: Diagnosis not present

## 2016-10-29 DIAGNOSIS — Z23 Encounter for immunization: Secondary | ICD-10-CM | POA: Diagnosis not present

## 2016-11-22 DIAGNOSIS — R1010 Upper abdominal pain, unspecified: Secondary | ICD-10-CM | POA: Diagnosis not present

## 2016-11-27 DIAGNOSIS — Z79899 Other long term (current) drug therapy: Secondary | ICD-10-CM | POA: Diagnosis not present

## 2016-11-27 DIAGNOSIS — M069 Rheumatoid arthritis, unspecified: Secondary | ICD-10-CM | POA: Diagnosis not present

## 2016-12-03 DIAGNOSIS — Z8349 Family history of other endocrine, nutritional and metabolic diseases: Secondary | ICD-10-CM | POA: Diagnosis not present

## 2016-12-03 DIAGNOSIS — E039 Hypothyroidism, unspecified: Secondary | ICD-10-CM | POA: Diagnosis not present

## 2016-12-25 DIAGNOSIS — R197 Diarrhea, unspecified: Secondary | ICD-10-CM | POA: Diagnosis not present

## 2016-12-25 DIAGNOSIS — G7000 Myasthenia gravis without (acute) exacerbation: Secondary | ICD-10-CM | POA: Diagnosis not present

## 2016-12-25 DIAGNOSIS — A0472 Enterocolitis due to Clostridium difficile, not specified as recurrent: Secondary | ICD-10-CM | POA: Diagnosis not present

## 2017-01-06 DIAGNOSIS — H01003 Unspecified blepharitis right eye, unspecified eyelid: Secondary | ICD-10-CM | POA: Diagnosis not present

## 2017-01-06 DIAGNOSIS — G7000 Myasthenia gravis without (acute) exacerbation: Secondary | ICD-10-CM | POA: Diagnosis not present

## 2017-01-06 DIAGNOSIS — M069 Rheumatoid arthritis, unspecified: Secondary | ICD-10-CM | POA: Diagnosis not present

## 2017-01-06 DIAGNOSIS — Z79899 Other long term (current) drug therapy: Secondary | ICD-10-CM | POA: Diagnosis not present

## 2017-01-22 ENCOUNTER — Encounter (INDEPENDENT_AMBULATORY_CARE_PROVIDER_SITE_OTHER): Payer: BLUE CROSS/BLUE SHIELD | Admitting: Ophthalmology

## 2017-01-22 DIAGNOSIS — M069 Rheumatoid arthritis, unspecified: Secondary | ICD-10-CM

## 2017-01-22 DIAGNOSIS — H2513 Age-related nuclear cataract, bilateral: Secondary | ICD-10-CM | POA: Diagnosis not present

## 2017-01-22 DIAGNOSIS — H43813 Vitreous degeneration, bilateral: Secondary | ICD-10-CM

## 2017-01-22 DIAGNOSIS — D3131 Benign neoplasm of right choroid: Secondary | ICD-10-CM

## 2017-01-22 IMAGING — CT CT CHEST W/O CM
2 of 3 series · 15 of 36 positions shown, 18 images · non-contrast
Comparison: 02/20/2015.

CLINICAL DATA: Pulmonary nodules.

EXAM:
CT CHEST WITHOUT CONTRAST
TECHNIQUE: Multidetector CT imaging of the chest was performed following the
standard protocol without IV contrast.

[Series 2: thorax · axial · 0.72mm/px · z∈[-282,+3]mm · 12 of 67 slices shown, 15 images]
[im 5/67  mediastinal]
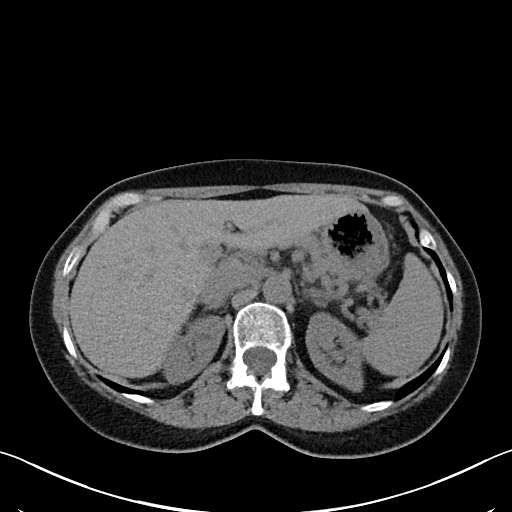
[im 5/67  lung]
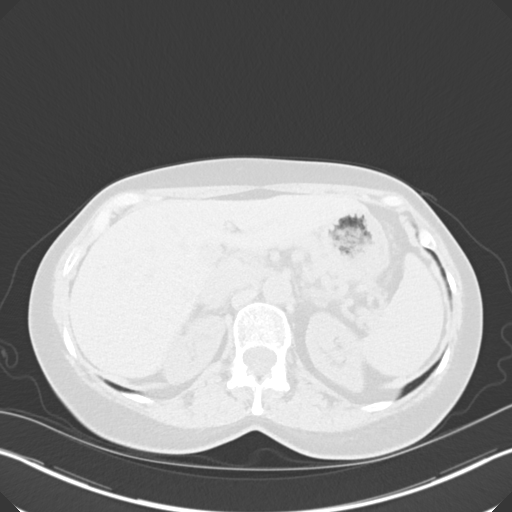
[im 10/67  lung]
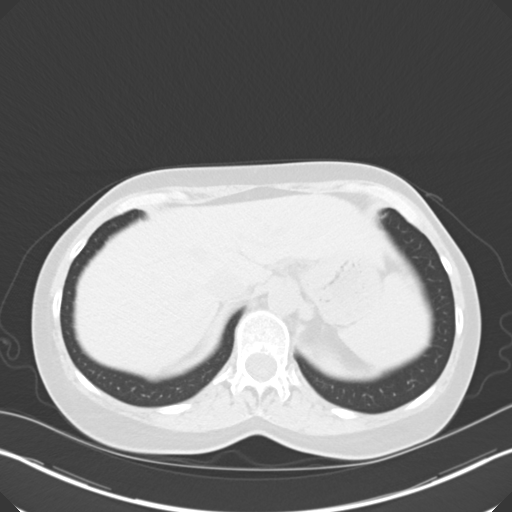
[im 15/67  lung]
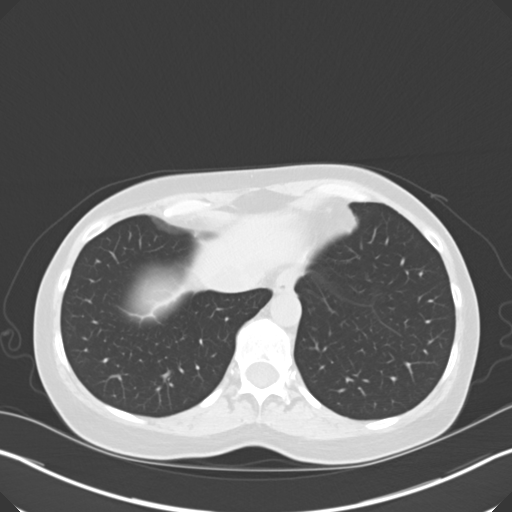
[im 20/67  lung]
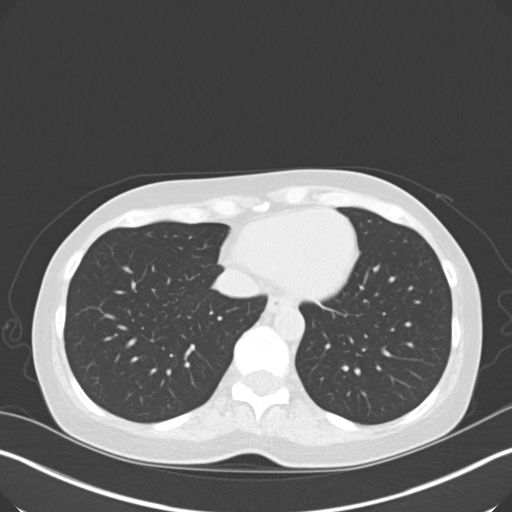
[im 25/67  mediastinal]
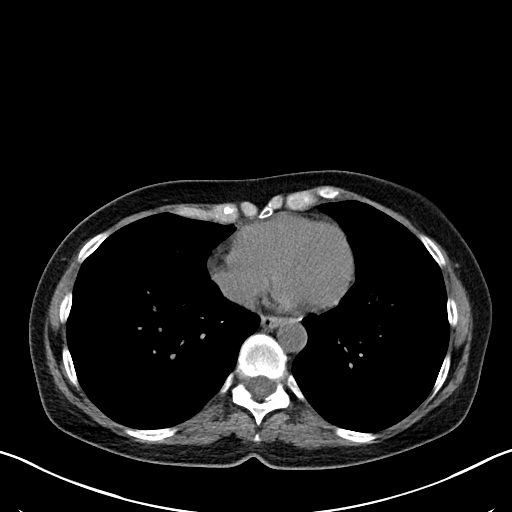
[im 25/67  lung]
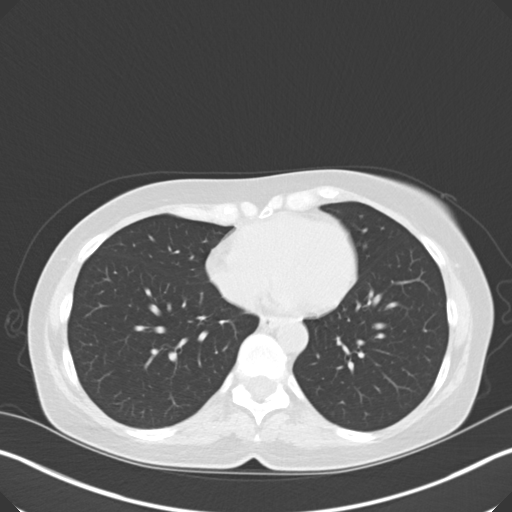
[im 30/67  lung]
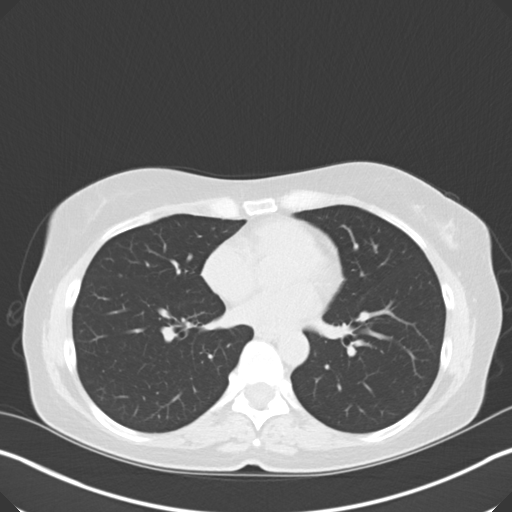
[im 37/67  lung]
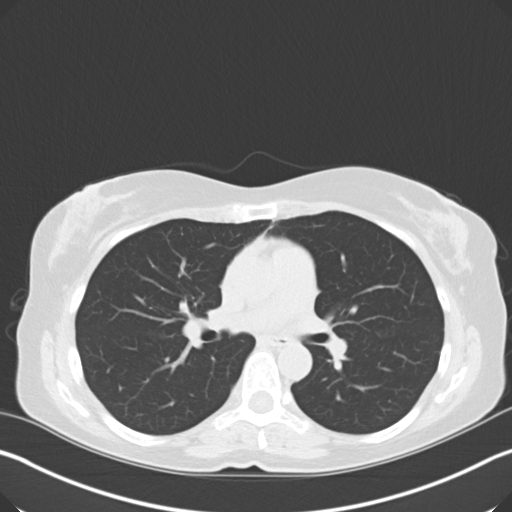
[im 42/67  lung]
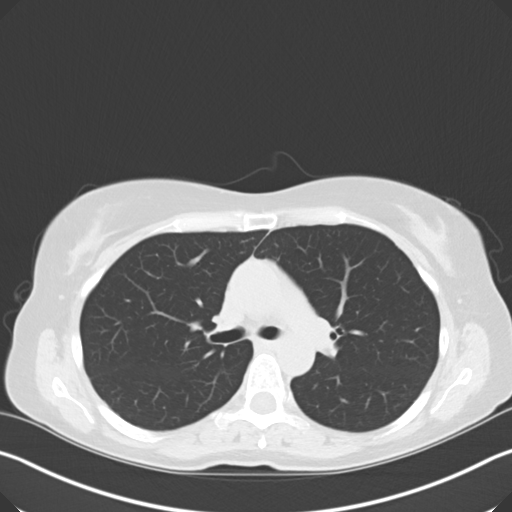
[im 47/67  mediastinal]
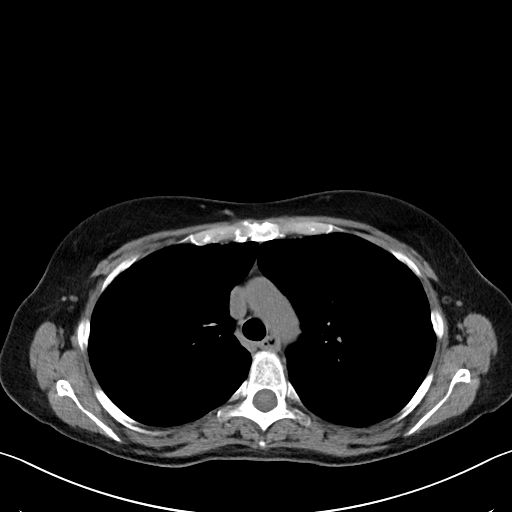
[im 47/67  lung]
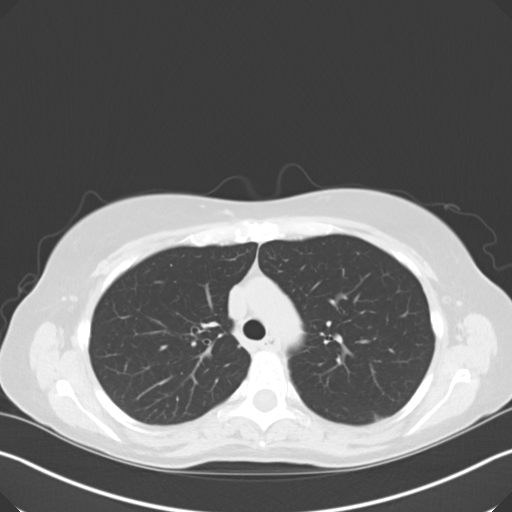
[im 52/67  lung]
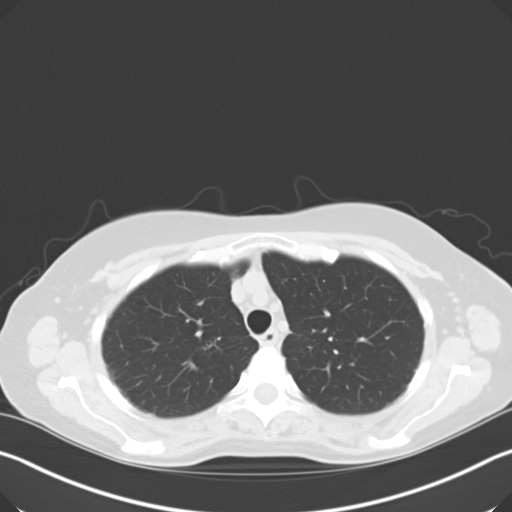
[im 57/67  lung]
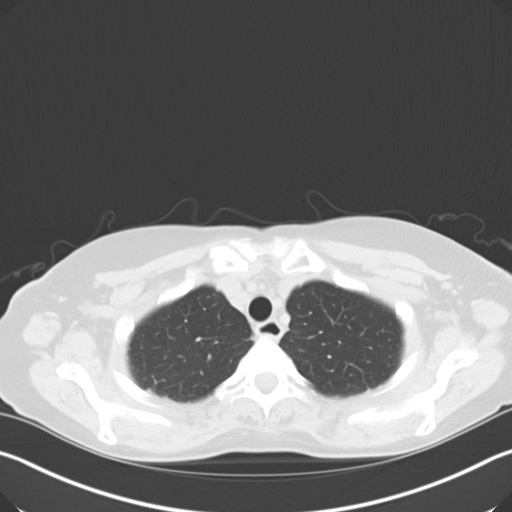
[im 62/67  lung]
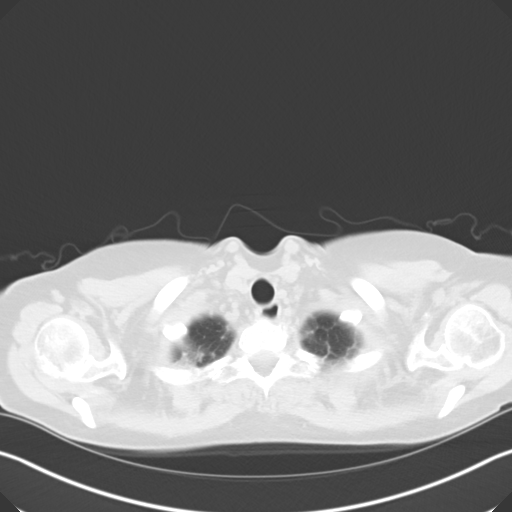

[Series 5: coronal · coronal · 0.65mm/px · 3 of 108 slices shown]
[im 22/108  lung]
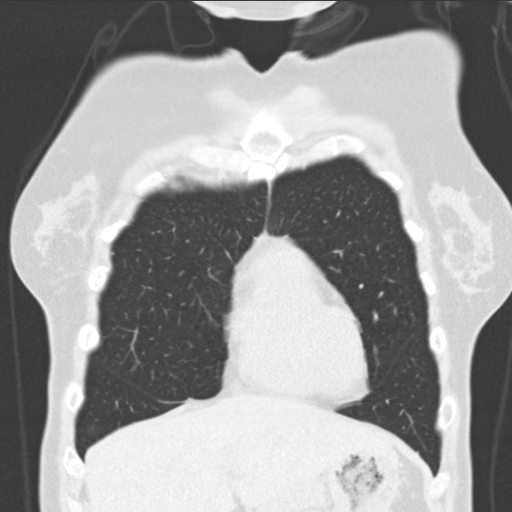
[im 43/108  lung]
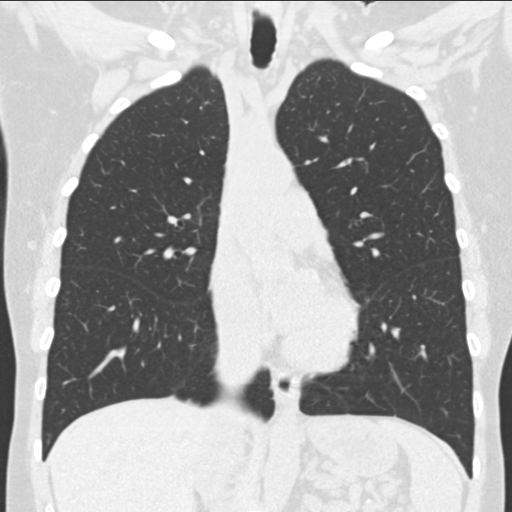
[im 65/108  lung]
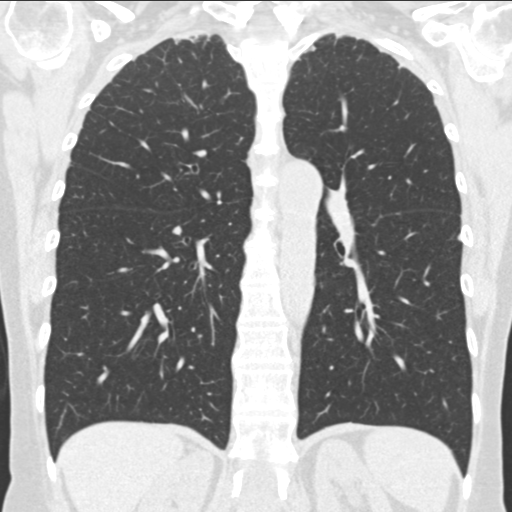

[15 of 36 positions shown; findings below may reference images not displayed]

FINDINGS: Mediastinum/Nodes: No pathologically enlarged mediastinal or
axillary lymph nodes. Hilar regions are difficult to definitively
evaluate without IV contrast. Heart size normal. No pericardial
effusion.

Lungs/Pleura: Biapical pleural parenchymal scarring. A few scattered
subpleural nodules measure 6 mm or less in size, stable and likely
subpleural lymph nodes. No pleural fluid. Airway is unremarkable.

Upper abdomen: Visualized portions of the liver, adrenal glands,
kidneys, spleen, pancreas and stomach are grossly unremarkable.

Musculoskeletal: No worrisome lytic or sclerotic lesions.
IMPRESSION: Scattered subpleural nodules are unchanged and indicative of benign
subpleural lymph nodes.

## 2017-02-05 DIAGNOSIS — Z79899 Other long term (current) drug therapy: Secondary | ICD-10-CM | POA: Diagnosis not present

## 2017-02-05 DIAGNOSIS — M0579 Rheumatoid arthritis with rheumatoid factor of multiple sites without organ or systems involvement: Secondary | ICD-10-CM | POA: Diagnosis not present

## 2017-02-05 DIAGNOSIS — R5383 Other fatigue: Secondary | ICD-10-CM | POA: Diagnosis not present

## 2017-02-05 DIAGNOSIS — M2550 Pain in unspecified joint: Secondary | ICD-10-CM | POA: Diagnosis not present

## 2017-03-06 ENCOUNTER — Other Ambulatory Visit: Payer: Self-pay | Admitting: Neurology

## 2017-03-30 DIAGNOSIS — R55 Syncope and collapse: Secondary | ICD-10-CM | POA: Diagnosis not present

## 2017-03-30 DIAGNOSIS — R0789 Other chest pain: Secondary | ICD-10-CM | POA: Diagnosis not present

## 2017-03-30 DIAGNOSIS — R51 Headache: Secondary | ICD-10-CM | POA: Diagnosis not present

## 2017-03-30 DIAGNOSIS — G43B0 Ophthalmoplegic migraine, not intractable: Secondary | ICD-10-CM | POA: Diagnosis not present

## 2017-03-30 DIAGNOSIS — I10 Essential (primary) hypertension: Secondary | ICD-10-CM | POA: Diagnosis not present

## 2017-04-23 DIAGNOSIS — L84 Corns and callosities: Secondary | ICD-10-CM | POA: Diagnosis not present

## 2017-04-23 DIAGNOSIS — H6982 Other specified disorders of Eustachian tube, left ear: Secondary | ICD-10-CM | POA: Diagnosis not present

## 2017-04-23 DIAGNOSIS — E039 Hypothyroidism, unspecified: Secondary | ICD-10-CM | POA: Diagnosis not present

## 2017-04-24 ENCOUNTER — Ambulatory Visit (INDEPENDENT_AMBULATORY_CARE_PROVIDER_SITE_OTHER): Payer: BLUE CROSS/BLUE SHIELD | Admitting: Neurology

## 2017-04-24 ENCOUNTER — Encounter: Payer: Self-pay | Admitting: Neurology

## 2017-04-24 VITALS — BP 106/68 | HR 77 | Wt 144.4 lb

## 2017-04-24 DIAGNOSIS — G4489 Other headache syndrome: Secondary | ICD-10-CM | POA: Diagnosis not present

## 2017-04-24 DIAGNOSIS — G7000 Myasthenia gravis without (acute) exacerbation: Secondary | ICD-10-CM

## 2017-04-24 NOTE — Patient Instructions (Signed)
   We will check MRI brain and MRA head to evaluate the headache.

## 2017-04-24 NOTE — Progress Notes (Signed)
Reason for visit: Myasthenia gravis  Angel Roberts is an 56 y.o. female  History of present illness:  Angel Roberts is a 56 year old right-handed white female with a history of myasthenia gravis primarily with ocular features. The patient also has a history of rheumatoid arthritis. The patient takes Mestinon for her symptoms, she will have occasional problems with double vision. The patient has been under some stress recently as her daughter fell and broke her elbow and is now living at home with her. When she is under stress, her symptoms seem to worsen some. The problems with C. difficile colitis has improved. When she was helping her daughter move from [place] back home 3 weeks ago, the patient was carrying a heavy box about 5 city blocks. The patient had sudden onset of a generalized headache associated with a blackout of vision that lasted a few moments. The onset of the headache was very rapid and severe, and persisted for least 3 days. The patient went to the emergency room for an evaluation, but a CT scan of the brain was never done. The patient did report some neck stiffness with the headache. The headache at this point has improved. She comes in to this office for an evaluation. The patient did not black out with the onset of the headache.  Past Medical History:  Diagnosis Date  . Endometriosis   . Fibromyalgia   . Hypothyroid   . Myasthenia gravis ([place])   . Rheumatoid arthritis [facility])     Past Surgical History:  Procedure Laterality Date  . FEMORAL HERNIA REPAIR    . FOOT SURGERY    . TONSILECTOMY/ADENOIDECTOMY WITH MYRINGOTOMY      Family History  Problem Relation Age of Onset  . Hypertension Mother   . Asthma Mother   . Hypertension Father   . Stroke Father   . HIV Brother   . Thyroid disease Daughter   . Stroke Paternal Grandmother   . COPD Paternal Grandfather   . Asthma Son     Social history:  reports that she has never smoked. She has never used smokeless  tobacco. She reports that she drinks alcohol. She reports that she does not use drugs.    Allergies  Allergen Reactions  . Other Anaphylaxis    Shell fish   . Augmentin [Amoxicillin-Pot Clavulanate] Diarrhea  . Erythromycin Nausea And Vomiting  . Flagyl [Metronidazole]     fainting  . Indocin [Indomethacin]     Headache   . Sulfa Antibiotics     Medications:  Prior to Admission medications   Medication Sig Start Date End Date Taking? Authorizing Provider  levothyroxine (SYNTHROID, LEVOTHROID) 125 MCG tablet Take 112 mcg by mouth daily before breakfast.    Yes [provider]  Multiple Vitamins-Minerals (MULTIVITAMIN PO) Take by mouth.   Yes [provider]  NALTREXONE HCL PO Take 1 tablet by mouth at bedtime.    Yes [provider]  naproxen (NAPROSYN) 500 MG tablet Take 500 mg by mouth 2 (two) times daily with a meal.   Yes [provider]  Nutritional Supplements (DHEA PO) Take by mouth daily.   Yes [provider]  Probiotic CAPS Take 1 capsule by mouth daily.   Yes [provider]  pyridostigmine (MESTINON) 60 MG tablet TAKE 1 TABLET(60 MG) BY MOUTH THREE TIMES DAILY Patient taking differently: TAKE 1 TABLET(60 MG) BY MOUTH TWO TIMES DAILY 07/12/16  Yes Kathrynn Ducking, MD    ROS:  Out  of a complete 14 system review of symptoms, the patient complains only of the following symptoms, and all other reviewed systems are negative.  Double vision Headache  Blood pressure 106/68, pulse 77, weight 144 lb 6.4 oz (65.5 kg).  Physical Exam  General: The patient is alert and cooperative at the time of the examination.  Skin: No significant peripheral edema is noted.   Neurologic Exam  Mental status: The patient is alert and oriented x 3 at the time of the examination. The patient has apparent normal recent and remote memory, with an apparently normal attention span and concentration ability.   Cranial nerves: Facial  symmetry is present. Speech is normal, no aphasia or dysarthria is noted. Extraocular movements are full. Visual fields are full.  Motor: The patient has good strength in all 4 extremities. The patient has significant joint deformities in both hands restricting function of the hands.  Sensory examination: Soft touch sensation is symmetric on the face, arms, and legs.  Coordination: The patient has good finger-nose-finger and heel-to-shin bilaterally.  Gait and station: The patient has a normal gait. Tandem gait is normal. Romberg is negative. No drift is seen.  Reflexes: Deep tendon reflexes are symmetric.   Assessment/Plan:  1. Myasthenia gravis with ocular features  2. History of headache, sudden onset  The patient had a sudden onset headache associated with transient visual loss during heavy physical activity 3 weeks ago. The patient has never undergone adequate evaluation for this headache. The patient has a history of a shellfish allergy with anaphylaxis, for this reason MRI of the brain and MRA of the head will be done instead of CT angiogram. We will try to exclude an underlying aneurysm. The patient will be maintained on the Mestinon. She will follow-up in 6 months, sooner if needed.   Angel Alexanders MD 04/24/2017 2:32 PM  [facility] [address] [place], [place] [address]  Phone [phone] Fax [phone]

## 2017-04-25 ENCOUNTER — Telehealth: Payer: Self-pay | Admitting: Neurology

## 2017-04-25 NOTE — Telephone Encounter (Signed)
I called for the Peer to Peer review, the MRI the brain and MRA of the head have been approved, the number is [phone], good until 05/24/2017.

## 2017-04-25 NOTE — Telephone Encounter (Signed)
[employer] approved the MRI Brain but not the MRA Head they wouldn't release the authorization number because they are linked together.. The phone number for the peer to peer is [phone]. The member ID is [id number] & DOB is 27-Mar-1961.Marland Kitchen

## 2017-04-28 NOTE — Telephone Encounter (Signed)
Noted, thank you for your help!  °

## 2017-04-29 ENCOUNTER — Telehealth: Payer: Self-pay | Admitting: Neurology

## 2017-04-29 NOTE — Telephone Encounter (Signed)
How do you advise?

## 2017-04-29 NOTE — Telephone Encounter (Signed)
I called patient. The patient has a dental implant, this should not be a problem with MRI.

## 2017-04-29 NOTE — Telephone Encounter (Signed)
Pt called said she will call her dentist to find out if tooth implant has metal in it. She will call back

## 2017-04-29 NOTE — Telephone Encounter (Signed)
Patient called office in reference her MRI scheduled on our mobile unit.  Patient states she forgot she has a metal implant in her mouth verified with her dentist.  Please call

## 2017-04-29 NOTE — Telephone Encounter (Signed)
Dr. Jannifer Franklin called the patient and talked to with and it should be okay with her to have the MRI next Wednesday 5/223/18 at our [facility] mobile unit.

## 2017-05-06 DIAGNOSIS — M0579 Rheumatoid arthritis with rheumatoid factor of multiple sites without organ or systems involvement: Secondary | ICD-10-CM | POA: Diagnosis not present

## 2017-05-06 DIAGNOSIS — M2550 Pain in unspecified joint: Secondary | ICD-10-CM | POA: Diagnosis not present

## 2017-05-06 DIAGNOSIS — R1013 Epigastric pain: Secondary | ICD-10-CM | POA: Diagnosis not present

## 2017-05-06 DIAGNOSIS — G7000 Myasthenia gravis without (acute) exacerbation: Secondary | ICD-10-CM | POA: Diagnosis not present

## 2017-05-06 DIAGNOSIS — R197 Diarrhea, unspecified: Secondary | ICD-10-CM | POA: Diagnosis not present

## 2017-05-07 ENCOUNTER — Ambulatory Visit (INDEPENDENT_AMBULATORY_CARE_PROVIDER_SITE_OTHER): Payer: BLUE CROSS/BLUE SHIELD

## 2017-05-07 DIAGNOSIS — G4489 Other headache syndrome: Secondary | ICD-10-CM

## 2017-05-08 ENCOUNTER — Telehealth: Payer: Self-pay | Admitting: Neurology

## 2017-05-08 NOTE — Telephone Encounter (Signed)
I called the patient. The MRI the brain and MRA are normal. The patient had a similar event of headache and visual disturbance while taking a hot shower, this likely represented a drop in blood pressure. The patient is to keep the water and a cooler temperature, and stay well-hydrated.   MRI brain 05/08/17:  IMPRESSION:  Normal MRI brain (without).   MRA head 05/08/17:  IMPRESSION:  Normal MRA head (without).

## 2017-05-13 DIAGNOSIS — R197 Diarrhea, unspecified: Secondary | ICD-10-CM | POA: Diagnosis not present

## 2017-06-17 DIAGNOSIS — R197 Diarrhea, unspecified: Secondary | ICD-10-CM | POA: Diagnosis not present

## 2017-07-11 ENCOUNTER — Other Ambulatory Visit: Payer: Self-pay | Admitting: Neurology

## 2017-07-16 DIAGNOSIS — Z1231 Encounter for screening mammogram for malignant neoplasm of breast: Secondary | ICD-10-CM | POA: Diagnosis not present

## 2017-07-16 DIAGNOSIS — Z01419 Encounter for gynecological examination (general) (routine) without abnormal findings: Secondary | ICD-10-CM | POA: Diagnosis not present

## 2017-07-16 DIAGNOSIS — Z6822 Body mass index (BMI) 22.0-22.9, adult: Secondary | ICD-10-CM | POA: Diagnosis not present

## 2017-07-31 DIAGNOSIS — Z13220 Encounter for screening for lipoid disorders: Secondary | ICD-10-CM | POA: Diagnosis not present

## 2017-07-31 DIAGNOSIS — R1032 Left lower quadrant pain: Secondary | ICD-10-CM | POA: Diagnosis not present

## 2017-08-06 DIAGNOSIS — Z79899 Other long term (current) drug therapy: Secondary | ICD-10-CM | POA: Diagnosis not present

## 2017-08-06 DIAGNOSIS — M0579 Rheumatoid arthritis with rheumatoid factor of multiple sites without organ or systems involvement: Secondary | ICD-10-CM | POA: Diagnosis not present

## 2017-08-06 DIAGNOSIS — M2550 Pain in unspecified joint: Secondary | ICD-10-CM | POA: Diagnosis not present

## 2017-09-01 DIAGNOSIS — R799 Abnormal finding of blood chemistry, unspecified: Secondary | ICD-10-CM | POA: Diagnosis not present

## 2017-10-16 DIAGNOSIS — Z23 Encounter for immunization: Secondary | ICD-10-CM | POA: Diagnosis not present

## 2017-10-27 ENCOUNTER — Ambulatory Visit: Payer: BLUE CROSS/BLUE SHIELD | Admitting: Neurology

## 2017-10-27 ENCOUNTER — Encounter: Payer: Self-pay | Admitting: Neurology

## 2017-10-27 ENCOUNTER — Encounter (INDEPENDENT_AMBULATORY_CARE_PROVIDER_SITE_OTHER): Payer: Self-pay

## 2017-10-27 VITALS — BP 102/69 | HR 74 | Ht 68.0 in | Wt 151.5 lb

## 2017-10-27 DIAGNOSIS — G7000 Myasthenia gravis without (acute) exacerbation: Secondary | ICD-10-CM

## 2017-10-27 NOTE — Progress Notes (Signed)
Reason for visit: Myasthenia gravis  GISSELA Roberts is an 56 y.o. female  History of present illness:  Angel Roberts is a 56 year old right-handed white female with a history of myasthenia gravis and rheumatoid arthritis.  The patient was given a trial on Humira, but she could not tolerate this medication due to headaches, numbness, diarrhea and nausea.  The patient is relatively stable with her myasthenic symptoms.  She does have some ptosis of the left eye and some occasional double vision that is usually worse when she is tired.  The patient does not take 3 Mestinon tablets daily, she takes 1 twice a day, taking higher doses oftentimes results in diarrhea.  The patient denies any weakness of the arms or legs, she denies any problems with speech or swallowing.  She believes that her myasthenia symptoms have been stable.  The double vision and ptosis does not prevent her from driving a car.   Past Medical History:  Diagnosis Date  . Endometriosis   . Fibromyalgia   . Hypothyroid   . Myasthenia gravis ([place])   . Rheumatoid arthritis [facility])     Past Surgical History:  Procedure Laterality Date  . FEMORAL HERNIA REPAIR    . FOOT SURGERY    . TONSILECTOMY/ADENOIDECTOMY WITH MYRINGOTOMY      Family History  Problem Relation Age of Onset  . Hypertension Mother   . Asthma Mother   . Hypertension Father   . Stroke Father   . HIV Brother   . Thyroid disease Daughter   . Stroke Paternal Grandmother   . COPD Paternal Grandfather   . Asthma Son     Social history:  reports that  has never smoked. she has never used smokeless tobacco. She reports that she drinks alcohol. She reports that she does not use drugs.    Allergies  Allergen Reactions  . Other Anaphylaxis    Shell fish   . Augmentin [Amoxicillin-Pot Clavulanate] Diarrhea  . Erythromycin Nausea And Vomiting  . Flagyl [Metronidazole]     fainting  . Indocin [Indomethacin]     Headache   . Sulfa Antibiotics   . Humira  [Adalimumab] Rash    Medications:  Prior to Admission medications   Medication Sig Start Date End Date Taking? Authorizing Provider  levothyroxine (SYNTHROID, LEVOTHROID) 125 MCG tablet Take 112 mcg by mouth daily before breakfast.    Yes [provider]  Multiple Vitamins-Minerals (MULTIVITAMIN PO) Take by mouth.   Yes [provider]  naproxen (NAPROSYN) 500 MG tablet Take 500 mg by mouth 2 (two) times daily with a meal.   Yes [provider]  Nutritional Supplements (DHEA PO) Take by mouth daily.   Yes [provider]  Probiotic CAPS Take 1 capsule 2 (two) times daily by mouth.    Yes [provider]  pyridostigmine (MESTINON) 60 MG tablet TAKE 1 TABLET(60 MG) BY MOUTH THREE TIMES DAILY Patient taking differently: TAKE 1 TABLET(60 MG) BY MOUTH TWO TIMES DAILY 07/12/16  Yes Kathrynn Ducking, MD    ROS:  Out of a complete 14 system review of symptoms, the patient complains only of the following symptoms, and all other reviewed systems are negative.  Eye discharge, eye redness, double vision Leg swelling Flushing Diarrhea, nausea Frequent waking Frequency of urination, urinary urgency Leg swelling, aching muscles Skin rash Facial drooping  Blood pressure 102/69, pulse 74, height 5\' 8"  (1.727 m), weight 151 lb 8 oz (68.7 kg).  Physical Exam  General:  The patient is alert and cooperative at the time of the examination.  Skin: No significant peripheral edema is noted.  Significant deformities of the MP joints of the hands are noted bilaterally.   Neurologic Exam  Mental status: The patient is alert and oriented x 3 at the time of the examination. The patient has apparent normal recent and remote memory, with an apparently normal attention span and concentration ability.   Cranial nerves: Facial symmetry is present, with exception of mild left-sided ptosis. Speech is normal, no aphasia or dysarthria is noted. Extraocular movements  are full. Visual fields are full.  Motor: The patient has good strength in all 4 extremities.  With arms outstretched 1 minute, no fatigable weakness of the deltoid muscles was noted.  Sensory examination: Soft touch sensation is symmetric on the face, arms, and legs.  Coordination: The patient has good finger-nose-finger and heel-to-shin bilaterally.  Gait and station: The patient has a normal gait. Tandem gait is normal. Romberg is negative. No drift is seen.  Reflexes: Deep tendon reflexes are symmetric.   MRI brain 05/08/17:  IMPRESSION:  Normal MRI brain (without).   MRA head 05/08/17:  IMPRESSION:  Normal MRA head (without).  * MRI scan images were reviewed online. I agree with the written report.    Assessment/Plan:  1.  Myasthenia gravis  2.  Rheumatoid arthritis  The patient has not had any recurrence of severe headache that she had on her last visit, MRI of the brain and MRA of the head were unremarkable.  The patient has been on Mestinon 60 mg twice daily, she can take an extra 1/2 tablet if needed for her myasthenia when she is symptomatic.  The patient will follow-up in about 6 months.  She seems to be quite stable with her myasthenia.  Jill Alexanders MD 10/27/2017 8:06 AM  [facility] [address] [place], [place] [address]  Phone [phone] Fax [phone]

## 2017-11-13 DIAGNOSIS — R197 Diarrhea, unspecified: Secondary | ICD-10-CM | POA: Diagnosis not present

## 2017-11-13 DIAGNOSIS — K580 Irritable bowel syndrome with diarrhea: Secondary | ICD-10-CM | POA: Diagnosis not present

## 2017-11-14 DIAGNOSIS — R197 Diarrhea, unspecified: Secondary | ICD-10-CM | POA: Diagnosis not present

## 2017-12-23 DIAGNOSIS — M0579 Rheumatoid arthritis with rheumatoid factor of multiple sites without organ or systems involvement: Secondary | ICD-10-CM | POA: Diagnosis not present

## 2017-12-23 DIAGNOSIS — M2550 Pain in unspecified joint: Secondary | ICD-10-CM | POA: Diagnosis not present

## 2017-12-23 DIAGNOSIS — Z79899 Other long term (current) drug therapy: Secondary | ICD-10-CM | POA: Diagnosis not present

## 2018-01-12 DIAGNOSIS — L821 Other seborrheic keratosis: Secondary | ICD-10-CM | POA: Diagnosis not present

## 2018-01-22 ENCOUNTER — Ambulatory Visit (INDEPENDENT_AMBULATORY_CARE_PROVIDER_SITE_OTHER): Payer: BLUE CROSS/BLUE SHIELD | Admitting: Ophthalmology

## 2018-02-20 ENCOUNTER — Encounter (INDEPENDENT_AMBULATORY_CARE_PROVIDER_SITE_OTHER): Payer: BLUE CROSS/BLUE SHIELD | Admitting: Ophthalmology

## 2018-02-20 DIAGNOSIS — H2513 Age-related nuclear cataract, bilateral: Secondary | ICD-10-CM | POA: Diagnosis not present

## 2018-02-20 DIAGNOSIS — H43813 Vitreous degeneration, bilateral: Secondary | ICD-10-CM

## 2018-02-20 DIAGNOSIS — D3131 Benign neoplasm of right choroid: Secondary | ICD-10-CM | POA: Diagnosis not present

## 2018-02-20 DIAGNOSIS — M069 Rheumatoid arthritis, unspecified: Secondary | ICD-10-CM | POA: Diagnosis not present

## 2018-03-27 DIAGNOSIS — R109 Unspecified abdominal pain: Secondary | ICD-10-CM | POA: Diagnosis not present

## 2018-04-07 ENCOUNTER — Other Ambulatory Visit: Payer: Self-pay | Admitting: Obstetrics and Gynecology

## 2018-04-07 DIAGNOSIS — N6002 Solitary cyst of left breast: Secondary | ICD-10-CM

## 2018-04-07 DIAGNOSIS — N644 Mastodynia: Secondary | ICD-10-CM | POA: Diagnosis not present

## 2018-04-10 ENCOUNTER — Ambulatory Visit
Admission: RE | Admit: 2018-04-10 | Discharge: 2018-04-10 | Disposition: A | Discharge status: Home / Self Care | Payer: BLUE CROSS/BLUE SHIELD | Source: Ambulatory Visit | Attending: Obstetrics and Gynecology | Admitting: Obstetrics and Gynecology

## 2018-04-10 ENCOUNTER — Ambulatory Visit: Payer: BLUE CROSS/BLUE SHIELD

## 2018-04-10 ENCOUNTER — Other Ambulatory Visit: Payer: BLUE CROSS/BLUE SHIELD

## 2018-04-10 DIAGNOSIS — R922 Inconclusive mammogram: Secondary | ICD-10-CM | POA: Diagnosis not present

## 2018-04-10 DIAGNOSIS — N6002 Solitary cyst of left breast: Secondary | ICD-10-CM

## 2018-04-10 DIAGNOSIS — N644 Mastodynia: Secondary | ICD-10-CM

## 2018-04-16 DIAGNOSIS — Z9109 Other allergy status, other than to drugs and biological substances: Secondary | ICD-10-CM | POA: Diagnosis not present

## 2018-04-16 DIAGNOSIS — E039 Hypothyroidism, unspecified: Secondary | ICD-10-CM | POA: Diagnosis not present

## 2018-04-16 DIAGNOSIS — K148 Other diseases of tongue: Secondary | ICD-10-CM | POA: Diagnosis not present

## 2018-04-28 ENCOUNTER — Ambulatory Visit: Payer: BLUE CROSS/BLUE SHIELD | Admitting: Neurology

## 2018-04-28 ENCOUNTER — Encounter: Payer: Self-pay | Admitting: Neurology

## 2018-04-28 VITALS — BP 108/71 | HR 87 | Ht 68.0 in | Wt 151.0 lb

## 2018-04-28 DIAGNOSIS — G7000 Myasthenia gravis without (acute) exacerbation: Secondary | ICD-10-CM

## 2018-04-28 MED ORDER — PYRIDOSTIGMINE BROMIDE 60 MG PO TABS: ORAL_TABLET | ORAL | 3 refills | Status: DC

## 2018-04-28 NOTE — Progress Notes (Signed)
Reason for visit: Myasthenia gravis  Angel Roberts is an 57 y.o. female  History of present illness:  Angel Roberts is a 57 year old right-handed white female with a history of rheumatoid arthritis and myasthenia gravis primarily with ocular features.  The patient has some intermittent ptosis of the left eye, she will have double vision on occasion.  She is able to get through the day fairly well without any significant problems.  She has not noted any progression or worsening of her symptoms since seen 6 months ago.  The patient has had a lot of allergies affecting the eyes with the pollen.  The patient is not on any disease modifying agents for her rheumatoid arthritis or for her myasthenia gravis.  The patient returns to the office today for an evaluation.  She does have some occasional stomach cramps associated with the use of the Mestinon, she takes 60 mg twice daily.  Past Medical History:  Diagnosis Date  . Endometriosis   . Fibromyalgia   . Hypothyroid   . Myasthenia gravis ([place])   . Rheumatoid arthritis [facility])     Past Surgical History:  Procedure Laterality Date  . FEMORAL HERNIA REPAIR    . FOOT SURGERY    . TONSILECTOMY/ADENOIDECTOMY WITH MYRINGOTOMY      Family History  Problem Relation Age of Onset  . Hypertension Mother   . Asthma Mother   . Hypertension Father   . Stroke Father   . HIV Brother   . Thyroid disease Daughter   . Stroke Paternal Grandmother   . COPD Paternal Grandfather   . Asthma Son     Social history:  reports that she has never smoked. She has never used smokeless tobacco. She reports that she drinks alcohol. She reports that she does not use drugs.    Allergies  Allergen Reactions  . Other Anaphylaxis    Shell fish   . Augmentin [Amoxicillin-Pot Clavulanate] Diarrhea  . Erythromycin Nausea And Vomiting  . Flagyl [Metronidazole]     fainting  . Indocin [Indomethacin]     Headache   . Sulfa Antibiotics   . Humira [Adalimumab]  Rash    Joint pain, headaches    Medications:  Prior to Admission medications   Medication Sig Start Date End Date Taking? Authorizing Provider  levothyroxine (SYNTHROID, LEVOTHROID) 125 MCG tablet Take 112 mcg by mouth daily before breakfast.    Yes [provider]  Multiple Vitamins-Minerals (MULTIVITAMIN PO) Take by mouth.   Yes [provider]  naproxen (NAPROSYN) 500 MG tablet Take 500 mg by mouth 2 (two) times daily with a meal.   Yes [provider]  Nutritional Supplements (DHEA PO) Take by mouth daily.   Yes [provider]  Probiotic CAPS Take 1 capsule 2 (two) times daily by mouth.    Yes [provider]  pyridostigmine (MESTINON) 60 MG tablet TAKE 1 TABLET(60 MG) BY MOUTH TWO TIMES DAILY 04/28/18  Yes Kathrynn Ducking, MD    ROS:  Out of a complete 14 system review of symptoms, the patient complains only of the following symptoms, and all other reviewed systems are negative.  Double vision  Blood pressure 108/71, pulse 87, height 5\' 8"  (1.727 m), weight 151 lb (68.5 kg).  Physical Exam  General: The patient is alert and cooperative at the time of the examination.  Skin: No significant peripheral edema is noted.  The patient has significant deformities of the MP joints of the hands bilaterally.  Neurologic Exam  Mental status: The patient is alert and oriented x 3 at the time of the examination. The patient has apparent normal recent and remote memory, with an apparently normal attention span and concentration ability.   Cranial nerves: Facial symmetry is present. Speech is normal, no aphasia or dysarthria is noted. Extraocular movements are full. Visual fields are full.  With superior gaze for 1 minute, the patient reports some double vision almost immediately, there is slight exotropia of the left eye, after 1 minute she develops to 3 mm of ptosis of the left eye.  Motor: The patient has good strength in all 4  extremities.  With arms outstretched for 1 minute, no fatigable weakness of the deltoid muscles has been noted.  Sensory examination: Soft touch sensation is symmetric on the face, arms, and legs.  Coordination: The patient has good finger-nose-finger and heel-to-shin bilaterally.  Gait and station: The patient has a normal gait. Tandem gait is normal. Romberg is negative. No drift is seen.  Reflexes: Deep tendon reflexes are symmetric.   Assessment/Plan:  1.  Rheumatoid arthritis  2.  Myasthenia gravis with ocular features  The patient may do better with tolerance of the Mestinon by taking 1/2 tablet 4 times daily rather than 1 full tablet twice a day.  The patient was given a prescription for the Mestinon, she will follow-up in 6 months.  She will call if there is any progression of her disease.  Jill Alexanders MD 04/28/2018 8:24 AM  [facility] [address] [place], [place] [address]  Phone [phone] Fax [phone]

## 2018-05-15 DIAGNOSIS — E039 Hypothyroidism, unspecified: Secondary | ICD-10-CM | POA: Diagnosis not present

## 2018-05-20 DIAGNOSIS — J029 Acute pharyngitis, unspecified: Secondary | ICD-10-CM | POA: Diagnosis not present

## 2018-05-20 DIAGNOSIS — J019 Acute sinusitis, unspecified: Secondary | ICD-10-CM | POA: Diagnosis not present

## 2018-05-20 DIAGNOSIS — K120 Recurrent oral aphthae: Secondary | ICD-10-CM | POA: Diagnosis not present

## 2018-07-06 DIAGNOSIS — M0579 Rheumatoid arthritis with rheumatoid factor of multiple sites without organ or systems involvement: Secondary | ICD-10-CM | POA: Diagnosis not present

## 2018-07-06 DIAGNOSIS — Z79899 Other long term (current) drug therapy: Secondary | ICD-10-CM | POA: Diagnosis not present

## 2018-07-06 DIAGNOSIS — M2550 Pain in unspecified joint: Secondary | ICD-10-CM | POA: Diagnosis not present

## 2018-07-15 DIAGNOSIS — R682 Dry mouth, unspecified: Secondary | ICD-10-CM | POA: Diagnosis not present

## 2018-07-15 DIAGNOSIS — E039 Hypothyroidism, unspecified: Secondary | ICD-10-CM | POA: Diagnosis not present

## 2018-07-15 DIAGNOSIS — K148 Other diseases of tongue: Secondary | ICD-10-CM | POA: Diagnosis not present

## 2018-08-12 DIAGNOSIS — K1321 Leukoplakia of oral mucosa, including tongue: Secondary | ICD-10-CM | POA: Diagnosis not present

## 2018-08-12 DIAGNOSIS — K148 Other diseases of tongue: Secondary | ICD-10-CM | POA: Diagnosis not present

## 2018-08-12 DIAGNOSIS — K144 Atrophy of tongue papillae: Secondary | ICD-10-CM | POA: Diagnosis not present

## 2018-08-12 DIAGNOSIS — M359 Systemic involvement of connective tissue, unspecified: Secondary | ICD-10-CM | POA: Diagnosis not present

## 2018-08-13 DIAGNOSIS — E039 Hypothyroidism, unspecified: Secondary | ICD-10-CM | POA: Diagnosis not present

## 2018-08-20 DIAGNOSIS — D100 Benign neoplasm of lip: Secondary | ICD-10-CM | POA: Diagnosis not present

## 2018-08-20 DIAGNOSIS — K140 Glossitis: Secondary | ICD-10-CM | POA: Diagnosis not present

## 2018-08-20 DIAGNOSIS — K1321 Leukoplakia of oral mucosa, including tongue: Secondary | ICD-10-CM | POA: Diagnosis not present

## 2018-08-20 DIAGNOSIS — K117 Disturbances of salivary secretion: Secondary | ICD-10-CM | POA: Diagnosis not present

## 2018-08-20 DIAGNOSIS — K130 Diseases of lips: Secondary | ICD-10-CM | POA: Diagnosis not present

## 2018-09-02 DIAGNOSIS — K140 Glossitis: Secondary | ICD-10-CM | POA: Diagnosis not present

## 2018-09-02 DIAGNOSIS — Z23 Encounter for immunization: Secondary | ICD-10-CM | POA: Diagnosis not present

## 2018-10-29 ENCOUNTER — Ambulatory Visit: Payer: BLUE CROSS/BLUE SHIELD | Admitting: Neurology

## 2018-12-02 ENCOUNTER — Ambulatory Visit: Payer: BLUE CROSS/BLUE SHIELD | Admitting: Neurology

## 2018-12-02 ENCOUNTER — Encounter: Payer: Self-pay | Admitting: Neurology

## 2018-12-02 VITALS — BP 116/70 | HR 87 | Wt 154.0 lb

## 2018-12-02 DIAGNOSIS — G7000 Myasthenia gravis without (acute) exacerbation: Secondary | ICD-10-CM

## 2018-12-02 MED ORDER — PYRIDOSTIGMINE BROMIDE 60 MG PO TABS: 60.0000 mg | ORAL_TABLET | Freq: Three times a day (TID) | ORAL | 3 refills | Status: AC

## 2018-12-02 NOTE — Progress Notes (Signed)
Reason for visit: Myasthenia gravis  Angel Roberts is an 57 y.o. female  History of present illness:  Angel Roberts is a 57 year old right-handed white female with a history of myasthenia gravis primarily with ocular features.  The patient has rheumatoid arthritis, she is not on any disease modifying agents for either the myasthenia or the rheumatoid arthritis.  The patient takes Mestinon 1/2 tablet up to 4 times daily.  The patient has recently moved to the [place], [place] area, she has had increased stress with this and this has worsened her arthritis symptoms and her myasthenia.  The patient is having increased problems with double vision at times, and she has some perceived weakness of the legs.  She has not had any falls.  She denies issues with chewing or swallowing.  She returns to this office for an evaluation.  Past Medical History:  Diagnosis Date  . Endometriosis   . Fibromyalgia   . Hypothyroid   . Myasthenia gravis ([place])   . Rheumatoid arthritis [facility])     Past Surgical History:  Procedure Laterality Date  . FEMORAL HERNIA REPAIR    . FOOT SURGERY    . TONSILECTOMY/ADENOIDECTOMY WITH MYRINGOTOMY      Family History  Problem Relation Age of Onset  . Hypertension Mother   . Asthma Mother   . Hypertension Father   . Stroke Father   . HIV Brother   . Thyroid disease Daughter   . Stroke Paternal Grandmother   . COPD Paternal Grandfather   . Asthma Son     Social history:  reports that she has never smoked. She has never used smokeless tobacco. She reports current alcohol use. She reports that she does not use drugs.    Allergies  Allergen Reactions  . Other Anaphylaxis    Shell fish   . Augmentin [Amoxicillin-Pot Clavulanate] Diarrhea  . Erythromycin Nausea And Vomiting  . Flagyl [Metronidazole]     fainting  . Indocin [Indomethacin]     Headache   . Sulfa Antibiotics   . Humira [Adalimumab] Rash    Joint pain, headaches    Medications:    Prior to Admission medications   Medication Sig Start Date End Date Taking? Authorizing Provider  levothyroxine (SYNTHROID, LEVOTHROID) 100 MCG tablet Take 100 mcg by mouth daily before breakfast.   Yes [provider]  Multiple Vitamins-Minerals (MULTIVITAMIN PO) Take by mouth.   Yes [provider]  naproxen (NAPROSYN) 500 MG tablet Take 500 mg by mouth 2 (two) times daily with a meal.   Yes [provider]  Nutritional Supplements (DHEA PO) Take by mouth daily.   Yes [provider]  Probiotic CAPS Take 1 capsule 2 (two) times daily by mouth.    Yes [provider]  pyridostigmine (MESTINON) 60 MG tablet TAKE 1 TABLET(60 MG) BY MOUTH TWO TIMES DAILY 04/28/18  Yes Kathrynn Ducking, MD    ROS:  Out of a complete 14 system review of symptoms, the patient complains only of the following symptoms, and all other reviewed systems are negative.  Fatigue Double vision Excessive eating Diarrhea Frequent waking Frequency of urination Walking difficulty Weakness  Blood pressure 116/70, pulse 87, weight 154 lb (69.9 kg).  Physical Exam  General: The patient is alert and cooperative at the time of the examination.  Skin: No significant peripheral edema is noted.   Neurologic Exam  Mental status: The patient is alert and oriented x 3 at the time of the  examination. The patient has apparent normal recent and remote memory, with an apparently normal attention span and concentration ability.   Cranial nerves: Facial symmetry is present. Speech is normal, no aphasia or dysarthria is noted. Extraocular movements are full. Visual fields are full.  With superior gaze for 1 minute the patient notes some subjective double vision within 5 seconds.  There does not appear to be overt divergence of gaze or ptosis at the end of 1 minute.  Motor: The patient has good strength in all 4 extremities.  With the arms outstretched for 1 minute, no fatigable  weakness of the deltoid muscles was noted.  Sensory examination: Soft touch sensation is symmetric on the face, arms, and legs.  Coordination: The patient has good finger-nose-finger and heel-to-shin bilaterally.  Gait and station: The patient has a normal gait. Tandem gait is normal. Romberg is negative. No drift is seen.  Reflexes: Deep tendon reflexes are symmetric.   Assessment/Plan:  1.  Myasthenia gravis  2.  Rheumatoid arthritis  The patient will be given a prescription for her Mestinon.  I will try to make a referral to a neurologist in her area to continue management of her myasthenia.  She will follow-up here if needed.  Jill Alexanders MD 12/02/2018 12:42 PM  [facility] [address] [place], [place] [address]  Phone [phone] Fax [phone]

## 2019-01-27 DIAGNOSIS — H02402 Unspecified ptosis of left eyelid: Secondary | ICD-10-CM | POA: Diagnosis not present

## 2019-01-27 DIAGNOSIS — G7000 Myasthenia gravis without (acute) exacerbation: Secondary | ICD-10-CM | POA: Diagnosis not present

## 2019-01-28 DIAGNOSIS — G7000 Myasthenia gravis without (acute) exacerbation: Secondary | ICD-10-CM | POA: Diagnosis not present

## 2019-01-28 DIAGNOSIS — F439 Reaction to severe stress, unspecified: Secondary | ICD-10-CM | POA: Diagnosis not present

## 2019-01-28 DIAGNOSIS — E039 Hypothyroidism, unspecified: Secondary | ICD-10-CM | POA: Diagnosis not present

## 2019-02-01 DIAGNOSIS — J209 Acute bronchitis, unspecified: Secondary | ICD-10-CM | POA: Diagnosis not present

## 2019-04-02 DIAGNOSIS — R21 Rash and other nonspecific skin eruption: Secondary | ICD-10-CM | POA: Diagnosis not present

## 2019-04-29 DIAGNOSIS — M0579 Rheumatoid arthritis with rheumatoid factor of multiple sites without organ or systems involvement: Secondary | ICD-10-CM | POA: Diagnosis not present

## 2019-04-29 DIAGNOSIS — Z5181 Encounter for therapeutic drug level monitoring: Secondary | ICD-10-CM | POA: Diagnosis not present

## 2019-05-11 DIAGNOSIS — G7000 Myasthenia gravis without (acute) exacerbation: Secondary | ICD-10-CM | POA: Diagnosis not present

## 2019-05-11 DIAGNOSIS — R918 Other nonspecific abnormal finding of lung field: Secondary | ICD-10-CM | POA: Diagnosis not present

## 2019-06-01 DIAGNOSIS — H02402 Unspecified ptosis of left eyelid: Secondary | ICD-10-CM | POA: Diagnosis not present

## 2019-06-01 DIAGNOSIS — G7000 Myasthenia gravis without (acute) exacerbation: Secondary | ICD-10-CM | POA: Diagnosis not present

## 2019-06-08 DIAGNOSIS — Z119 Encounter for screening for infectious and parasitic diseases, unspecified: Secondary | ICD-10-CM | POA: Diagnosis not present

## 2019-06-08 DIAGNOSIS — Z0000 Encounter for general adult medical examination without abnormal findings: Secondary | ICD-10-CM | POA: Diagnosis not present

## 2019-06-08 DIAGNOSIS — M0579 Rheumatoid arthritis with rheumatoid factor of multiple sites without organ or systems involvement: Secondary | ICD-10-CM | POA: Diagnosis not present

## 2019-06-08 DIAGNOSIS — E038 Other specified hypothyroidism: Secondary | ICD-10-CM | POA: Diagnosis not present

## 2019-06-08 DIAGNOSIS — Z23 Encounter for immunization: Secondary | ICD-10-CM | POA: Diagnosis not present

## 2019-06-08 DIAGNOSIS — G7000 Myasthenia gravis without (acute) exacerbation: Secondary | ICD-10-CM | POA: Diagnosis not present

## 2019-06-08 DIAGNOSIS — E559 Vitamin D deficiency, unspecified: Secondary | ICD-10-CM | POA: Diagnosis not present

## 2019-06-08 DIAGNOSIS — R918 Other nonspecific abnormal finding of lung field: Secondary | ICD-10-CM | POA: Diagnosis not present

## 2019-06-08 DIAGNOSIS — Z13220 Encounter for screening for lipoid disorders: Secondary | ICD-10-CM | POA: Diagnosis not present

## 2019-06-11 DIAGNOSIS — E785 Hyperlipidemia, unspecified: Secondary | ICD-10-CM | POA: Diagnosis not present

## 2019-06-11 DIAGNOSIS — E7212 Methylenetetrahydrofolate reductase deficiency: Secondary | ICD-10-CM | POA: Diagnosis not present

## 2019-06-11 DIAGNOSIS — R5383 Other fatigue: Secondary | ICD-10-CM | POA: Diagnosis not present

## 2019-06-11 DIAGNOSIS — R5381 Other malaise: Secondary | ICD-10-CM | POA: Diagnosis not present

## 2019-06-11 DIAGNOSIS — Z1371 Encounter for nonprocreative screening for genetic disease carrier status: Secondary | ICD-10-CM | POA: Diagnosis not present

## 2019-06-11 DIAGNOSIS — E038 Other specified hypothyroidism: Secondary | ICD-10-CM | POA: Diagnosis not present

## 2019-07-29 DIAGNOSIS — K580 Irritable bowel syndrome with diarrhea: Secondary | ICD-10-CM | POA: Diagnosis not present

## 2019-07-30 DIAGNOSIS — E785 Hyperlipidemia, unspecified: Secondary | ICD-10-CM | POA: Diagnosis not present

## 2019-09-20 DIAGNOSIS — Z23 Encounter for immunization: Secondary | ICD-10-CM | POA: Diagnosis not present

## 2019-09-29 DIAGNOSIS — Z1151 Encounter for screening for human papillomavirus (HPV): Secondary | ICD-10-CM | POA: Diagnosis not present

## 2019-09-29 DIAGNOSIS — R5383 Other fatigue: Secondary | ICD-10-CM | POA: Diagnosis not present

## 2019-09-29 DIAGNOSIS — Z01419 Encounter for gynecological examination (general) (routine) without abnormal findings: Secondary | ICD-10-CM | POA: Diagnosis not present

## 2019-09-29 DIAGNOSIS — R000 Tachycardia, unspecified: Secondary | ICD-10-CM | POA: Diagnosis not present

## 2019-09-29 DIAGNOSIS — Z2009 Contact with and (suspected) exposure to other intestinal infectious diseases: Secondary | ICD-10-CM | POA: Diagnosis not present

## 2019-09-29 DIAGNOSIS — Z0000 Encounter for general adult medical examination without abnormal findings: Secondary | ICD-10-CM | POA: Diagnosis not present

## 2019-09-29 DIAGNOSIS — E038 Other specified hypothyroidism: Secondary | ICD-10-CM | POA: Diagnosis not present

## 2019-10-05 DIAGNOSIS — M0579 Rheumatoid arthritis with rheumatoid factor of multiple sites without organ or systems involvement: Secondary | ICD-10-CM | POA: Diagnosis not present

## 2019-10-05 DIAGNOSIS — R918 Other nonspecific abnormal finding of lung field: Secondary | ICD-10-CM | POA: Diagnosis not present

## 2019-10-05 DIAGNOSIS — Z23 Encounter for immunization: Secondary | ICD-10-CM | POA: Diagnosis not present

## 2019-10-05 DIAGNOSIS — R030 Elevated blood-pressure reading, without diagnosis of hypertension: Secondary | ICD-10-CM | POA: Diagnosis not present

## 2019-10-14 DIAGNOSIS — M0579 Rheumatoid arthritis with rheumatoid factor of multiple sites without organ or systems involvement: Secondary | ICD-10-CM | POA: Diagnosis not present

## 2019-10-14 DIAGNOSIS — L84 Corns and callosities: Secondary | ICD-10-CM | POA: Diagnosis not present

## 2019-10-14 DIAGNOSIS — Z8379 Family history of other diseases of the digestive system: Secondary | ICD-10-CM | POA: Diagnosis not present

## 2019-10-14 DIAGNOSIS — Z5181 Encounter for therapeutic drug level monitoring: Secondary | ICD-10-CM | POA: Diagnosis not present

## 2019-11-02 DIAGNOSIS — H02402 Unspecified ptosis of left eyelid: Secondary | ICD-10-CM | POA: Diagnosis not present

## 2019-11-02 DIAGNOSIS — G7000 Myasthenia gravis without (acute) exacerbation: Secondary | ICD-10-CM | POA: Diagnosis not present

## 2019-11-08 DIAGNOSIS — R918 Other nonspecific abnormal finding of lung field: Secondary | ICD-10-CM | POA: Diagnosis not present

## 2019-11-08 DIAGNOSIS — R911 Solitary pulmonary nodule: Secondary | ICD-10-CM | POA: Diagnosis not present

## 2019-11-16 DIAGNOSIS — Z03818 Encounter for observation for suspected exposure to other biological agents ruled out: Secondary | ICD-10-CM | POA: Diagnosis not present

## 2019-11-16 DIAGNOSIS — Z1159 Encounter for screening for other viral diseases: Secondary | ICD-10-CM | POA: Diagnosis not present

## 2019-11-28 DIAGNOSIS — Z881 Allergy status to other antibiotic agents status: Secondary | ICD-10-CM | POA: Diagnosis not present

## 2019-11-28 DIAGNOSIS — R110 Nausea: Secondary | ICD-10-CM | POA: Diagnosis not present

## 2019-11-28 DIAGNOSIS — Z883 Allergy status to other anti-infective agents status: Secondary | ICD-10-CM | POA: Diagnosis not present

## 2019-11-28 DIAGNOSIS — Z882 Allergy status to sulfonamides status: Secondary | ICD-10-CM | POA: Diagnosis not present

## 2019-11-28 DIAGNOSIS — Z91013 Allergy to seafood: Secondary | ICD-10-CM | POA: Diagnosis not present

## 2019-11-28 DIAGNOSIS — R519 Headache, unspecified: Secondary | ICD-10-CM | POA: Diagnosis not present

## 2019-11-28 DIAGNOSIS — R531 Weakness: Secondary | ICD-10-CM | POA: Diagnosis not present

## 2019-12-22 DIAGNOSIS — R0689 Other abnormalities of breathing: Secondary | ICD-10-CM | POA: Diagnosis not present

## 2019-12-22 DIAGNOSIS — Z1152 Encounter for screening for COVID-19: Secondary | ICD-10-CM | POA: Diagnosis not present

## 2019-12-22 DIAGNOSIS — J019 Acute sinusitis, unspecified: Secondary | ICD-10-CM | POA: Diagnosis not present

## 2019-12-22 DIAGNOSIS — R5382 Chronic fatigue, unspecified: Secondary | ICD-10-CM | POA: Diagnosis not present

## 2019-12-22 DIAGNOSIS — B9689 Other specified bacterial agents as the cause of diseases classified elsewhere: Secondary | ICD-10-CM | POA: Diagnosis not present

## 2019-12-23 DIAGNOSIS — R0689 Other abnormalities of breathing: Secondary | ICD-10-CM | POA: Diagnosis not present

## 2019-12-23 DIAGNOSIS — R05 Cough: Secondary | ICD-10-CM | POA: Diagnosis not present

## 2019-12-27 DIAGNOSIS — J019 Acute sinusitis, unspecified: Secondary | ICD-10-CM | POA: Diagnosis not present

## 2019-12-27 DIAGNOSIS — G4484 Primary exertional headache: Secondary | ICD-10-CM | POA: Diagnosis not present

## 2019-12-27 DIAGNOSIS — R42 Dizziness and giddiness: Secondary | ICD-10-CM | POA: Diagnosis not present

## 2019-12-27 DIAGNOSIS — B9689 Other specified bacterial agents as the cause of diseases classified elsewhere: Secondary | ICD-10-CM | POA: Diagnosis not present

## 2019-12-29 DIAGNOSIS — R519 Headache, unspecified: Secondary | ICD-10-CM | POA: Diagnosis not present

## 2019-12-29 DIAGNOSIS — G4484 Primary exertional headache: Secondary | ICD-10-CM | POA: Diagnosis not present

## 2020-01-06 DIAGNOSIS — Z0000 Encounter for general adult medical examination without abnormal findings: Secondary | ICD-10-CM | POA: Diagnosis not present

## 2020-01-06 DIAGNOSIS — Z23 Encounter for immunization: Secondary | ICD-10-CM | POA: Diagnosis not present

## 2020-01-06 DIAGNOSIS — E038 Other specified hypothyroidism: Secondary | ICD-10-CM | POA: Diagnosis not present

## 2020-01-06 DIAGNOSIS — R918 Other nonspecific abnormal finding of lung field: Secondary | ICD-10-CM | POA: Diagnosis not present

## 2020-03-22 DIAGNOSIS — Z91013 Allergy to seafood: Secondary | ICD-10-CM | POA: Diagnosis not present

## 2020-03-22 DIAGNOSIS — R109 Unspecified abdominal pain: Secondary | ICD-10-CM | POA: Diagnosis not present

## 2020-03-22 DIAGNOSIS — R1011 Right upper quadrant pain: Secondary | ICD-10-CM | POA: Diagnosis not present

## 2020-03-22 DIAGNOSIS — R3915 Urgency of urination: Secondary | ICD-10-CM | POA: Diagnosis not present

## 2020-04-06 DIAGNOSIS — R1011 Right upper quadrant pain: Secondary | ICD-10-CM | POA: Diagnosis not present

## 2020-04-13 DIAGNOSIS — M0579 Rheumatoid arthritis with rheumatoid factor of multiple sites without organ or systems involvement: Secondary | ICD-10-CM | POA: Diagnosis not present

## 2020-04-13 DIAGNOSIS — K529 Noninfective gastroenteritis and colitis, unspecified: Secondary | ICD-10-CM | POA: Diagnosis not present

## 2020-05-04 DIAGNOSIS — I10 Essential (primary) hypertension: Secondary | ICD-10-CM | POA: Diagnosis not present

## 2020-05-04 DIAGNOSIS — N2889 Other specified disorders of kidney and ureter: Secondary | ICD-10-CM | POA: Diagnosis not present

## 2020-06-06 DIAGNOSIS — J209 Acute bronchitis, unspecified: Secondary | ICD-10-CM | POA: Diagnosis not present

## 2020-07-03 DIAGNOSIS — S8992XA Unspecified injury of left lower leg, initial encounter: Secondary | ICD-10-CM | POA: Diagnosis not present

## 2020-07-03 DIAGNOSIS — M25561 Pain in right knee: Secondary | ICD-10-CM | POA: Diagnosis not present

## 2020-07-03 DIAGNOSIS — S8991XA Unspecified injury of right lower leg, initial encounter: Secondary | ICD-10-CM | POA: Diagnosis not present

## 2020-07-03 DIAGNOSIS — M069 Rheumatoid arthritis, unspecified: Secondary | ICD-10-CM | POA: Diagnosis not present

## 2020-07-03 DIAGNOSIS — M25662 Stiffness of left knee, not elsewhere classified: Secondary | ICD-10-CM | POA: Diagnosis not present

## 2020-07-05 DIAGNOSIS — R1031 Right lower quadrant pain: Secondary | ICD-10-CM | POA: Diagnosis not present

## 2020-07-05 DIAGNOSIS — R1010 Upper abdominal pain, unspecified: Secondary | ICD-10-CM | POA: Diagnosis not present

## 2020-07-21 DIAGNOSIS — Z20828 Contact with and (suspected) exposure to other viral communicable diseases: Secondary | ICD-10-CM | POA: Diagnosis not present

## 2020-07-28 DIAGNOSIS — R946 Abnormal results of thyroid function studies: Secondary | ICD-10-CM | POA: Diagnosis not present

## 2020-07-28 DIAGNOSIS — R5381 Other malaise: Secondary | ICD-10-CM | POA: Diagnosis not present

## 2020-07-28 DIAGNOSIS — R5383 Other fatigue: Secondary | ICD-10-CM | POA: Diagnosis not present

## 2020-07-28 DIAGNOSIS — E559 Vitamin D deficiency, unspecified: Secondary | ICD-10-CM | POA: Diagnosis not present

## 2020-09-14 DIAGNOSIS — H02402 Unspecified ptosis of left eyelid: Secondary | ICD-10-CM | POA: Diagnosis not present

## 2020-09-14 DIAGNOSIS — G7000 Myasthenia gravis without (acute) exacerbation: Secondary | ICD-10-CM | POA: Diagnosis not present

## 2020-10-13 DIAGNOSIS — M79605 Pain in left leg: Secondary | ICD-10-CM | POA: Diagnosis not present

## 2020-10-13 DIAGNOSIS — Z5181 Encounter for therapeutic drug level monitoring: Secondary | ICD-10-CM | POA: Diagnosis not present

## 2020-10-13 DIAGNOSIS — M0579 Rheumatoid arthritis with rheumatoid factor of multiple sites without organ or systems involvement: Secondary | ICD-10-CM | POA: Diagnosis not present

## 2020-10-31 DIAGNOSIS — M069 Rheumatoid arthritis, unspecified: Secondary | ICD-10-CM | POA: Diagnosis not present

## 2020-10-31 DIAGNOSIS — M79672 Pain in left foot: Secondary | ICD-10-CM | POA: Diagnosis not present

## 2020-11-08 DIAGNOSIS — Z13220 Encounter for screening for lipoid disorders: Secondary | ICD-10-CM | POA: Diagnosis not present

## 2020-11-08 DIAGNOSIS — M069 Rheumatoid arthritis, unspecified: Secondary | ICD-10-CM | POA: Diagnosis not present

## 2020-11-08 DIAGNOSIS — Z0000 Encounter for general adult medical examination without abnormal findings: Secondary | ICD-10-CM | POA: Diagnosis not present

## 2020-11-08 DIAGNOSIS — E039 Hypothyroidism, unspecified: Secondary | ICD-10-CM | POA: Diagnosis not present

## 2020-11-13 DIAGNOSIS — R918 Other nonspecific abnormal finding of lung field: Secondary | ICD-10-CM | POA: Diagnosis not present

## 2020-11-13 DIAGNOSIS — G7000 Myasthenia gravis without (acute) exacerbation: Secondary | ICD-10-CM | POA: Diagnosis not present

## 2024-09-05 ENCOUNTER — Other Ambulatory Visit: Payer: Self-pay

## 2024-09-05 ENCOUNTER — Emergency Department (HOSPITAL_COMMUNITY)

## 2024-09-05 ENCOUNTER — Emergency Department (HOSPITAL_COMMUNITY)
Admission: EM | Admit: 2024-09-05 | Discharge: 2024-09-06 | Disposition: A | Discharge status: Home / Self Care | Attending: Emergency Medicine | Admitting: Emergency Medicine

## 2024-09-05 ENCOUNTER — Encounter (HOSPITAL_COMMUNITY): Payer: Self-pay | Admitting: Emergency Medicine

## 2024-09-05 DIAGNOSIS — R0789 Other chest pain: Secondary | ICD-10-CM | POA: Insufficient documentation

## 2024-09-05 DIAGNOSIS — R079 Chest pain, unspecified: Secondary | ICD-10-CM

## 2024-09-05 LAB — CBC
HCT: 40.4 % (ref 36.0–46.0)
Hemoglobin: 13.1 g/dL (ref 12.0–15.0)
MCH: 30.5 pg (ref 26.0–34.0)
MCHC: 32.4 g/dL (ref 30.0–36.0)
MCV: 94.2 fL (ref 80.0–100.0)
Platelets: 237 K/uL (ref 150–400)
RBC: 4.29 MIL/uL (ref 3.87–5.11)
RDW: 13.4 % (ref 11.5–15.5)
WBC: 6.3 K/uL (ref 4.0–10.5)
nRBC: 0 % (ref 0.0–0.2)

## 2024-09-05 LAB — BASIC METABOLIC PANEL WITH GFR
Anion gap: 12 (ref 5–15)
BUN: 17 mg/dL (ref 8–23)
CO2: 23 mmol/L (ref 22–32)
Calcium: 9.3 mg/dL (ref 8.9–10.3)
Chloride: 102 mmol/L (ref 98–111)
Creatinine, Ser: 0.67 mg/dL (ref 0.44–1.00)
GFR, Estimated: >60 mL/min (ref >60)
Glucose, Bld: 109 mg/dL — ABNORMAL HIGH (ref 70–99)
Potassium: 4 mmol/L (ref 3.5–5.1)
Sodium: 137 mmol/L (ref 135–145)

## 2024-09-05 LAB — TROPONIN I (HIGH SENSITIVITY): Troponin I (High Sensitivity): 3 ng/L (ref <18)

## 2024-09-05 MED ORDER — IOHEXOL 350 MG/ML SOLN
75.0000 mL | Freq: Once | INTRAVENOUS | Status: AC | PRN
  Administered 2024-09-05: 75 mL via INTRAVENOUS

## 2024-09-05 NOTE — ED Notes (Signed)
 Blue top sent to lab.

## 2024-09-05 NOTE — ED Triage Notes (Signed)
 Patient complains of right side chest pain ongoing for 4 weeks. She describes the pain as heaviness and non radiating. Seen at [facility] and advised to come here for elevated D dimer. Reports exertional shortness of breath. Returned from [place] 9/17.

## 2024-09-05 NOTE — Progress Notes (Signed)
 SUBJECTIVE:  Angel Roberts is a 63 y.o. female with a past medical history of rheumatoid arthritis, myasthenia gravis, hypothyroidism presents today with a complaint of right-sided chest pain which has been intermittently present for almost 4 weeks however worsened in the last week and has become more consistent.  Patient reports that the pain does not change with movement, food intake or exertion consistently.  States that she does seem to notice it more with exertion and will have dyspnea concurrently with the discomfort when exerting.  States she has had the pain at rest and there is nothing that she has done or taken that has made any difference to her knowledge.  She states that she just recently came back from [place] where she was there for approximately 2 weeks and due to the continued discomfort her husband wanted her evaluated.  She denies any known fever but does report chills.  She reports generalized malaise over the past couple weeks but continued to feel that it was likely due to her activity level which was increased during her vacation.  She does have myasthenia gravis and reports that her flares typically are in a setting of weakness with no other additional symptoms routinely that she is aware of.  She is unsure of any trauma or strain to her extremity that may have caused the discomfort.  She denies any palpitations or increased heart rate but does report lower blood pressure readings than her baseline over the last few days.    Parts of patient history reviewed include PMH, problem list, medications, allergies, social history, family history, and surgical history.  OBJECTIVE: ROS:  General: Denies measurable fever, reports chills, no acute distress, reports fatigue Skin: Denies rash ENT: Denies ear pain, reports rhinorrhea, sore throat Neck: Denies pain or range of motion concern Lungs: Reports exertional dyspnea, denies cough Heart: Reports right sided chest pain, denies  palpitations Neurologic: Denies headache or lethargy Abdomen: Denies pain, nausea or vomiting  Vitals:   09/05/24 1321  BP: 108/72  BP Location: Left arm  Patient Position: Sitting  Pulse: 78  Temp: 98.2 F (36.8 C)  TempSrc: Oral  SpO2: 98%  Weight: 70.9 kg (156 lb 3.2 oz)  Height: 1.715 m (5' 7.5)     General: Patient is well-nourished and in no acute distress.  Skin:  No rash noted. Head:  Normocephalic, atraumatic.   Eyes: EOM intact Ears:  Gross hearing intact.  Canals without erythema or swelling.  TMs pearly bilaterally. Nose/Sinuses:  Nares Normal.   Mouth/pharynx: Gingiva and mucosa pink.  No erythema or exudate bilaterally.   Neck: NROM Lungs: CTA bilaterally.  Normal effort. Heart: NRR with no murmurs, rubs, or gallops.  Reproducible right sided chest pain upon palpitation Neurologic: Alert and Oriented Abdomen: Soft, nontender  Rapid flu: Negative Rapid COVID: Negative  EKG: EKG indicated normal sinus rhythm with no previous tracings to compare to.  Readout did indicate possible left atrial enlargement however upon review of lead II there is numerous artifact making clear definition of possibility difficult to ascertain.  Looking at lead II there does seem to be a slight increase in the P wave elevation correlating with possibility of enlargement.    XR CHEST 2 VIEWS 09/05/2024 1:57 PM   INDICATION: Chest pain, unspecified \ R07.9 Chest pain, unspecified  COMPARISON: None   FINDINGS:   .  Supportive devices: No visualized internal supportive devices. .  Cardiovascular/Mediastinum: Normal size and contours of the cardiomediastinal silhouette. .  Lungs/Pleura: No focal  consolidations. No pleural effusion. No pneumothorax. .  Other: Visualized abdomen is unremarkable. No acute osseous abnormality.   IMPRESSION:   No radiographic evidence of acute cardiac or pulmonary abnormality.  This result has not been signed. Information might be incomplete.     on  09/05/24  2:21 PM  ASSESSMENT:   1. Right-sided chest pain      2. Chest pain, unspecified type  ECG 12 lead   XR Chest 2 Views   lidocaine  (XYLOCAINE ) 2 % viscous solution 15 mL   Basic Metabolic Panel   D-Dimer, Quantitative   C-Reactive Protein (CRP)   Sedimentation Rate (ESR)   CBC with Differential   aluminum-magnesium hydroxide (MAG-AL) 200-200 mg/5 mL suspension 30 mL   DISCONTINUED: aluminum-magnesium hydroxide (MAG-AL) 200-200 mg/5 mL suspension 30 mL    3. Fatigue, unspecified type  POC Influenza A&B NAT (IDNOW)   POC SARS-COV-2 SYMPTOMATIC (IDNOW)   Basic Metabolic Panel   D-Dimer, Quantitative   C-Reactive Protein (CRP)   Sedimentation Rate (ESR)   CBC with Differential    4. Myasthenia gravis    (CMD)      5. Health education/counseling        MDM: Angel Roberts is a 63 y.o. female with a past medical history of rheumatoid arthritis, myasthenia gravis, hypothyroidism presents today with a complaint of right-sided chest pain which has been intermittently present for almost 4 weeks however worsened in the last week and has become more consistent.  Patient reports that the pain does not change with movement, food intake or exertion consistently.  States that she does seem to notice it more with exertion and will have dyspnea concurrently with the discomfort when exerting.  States she has had the pain at rest and there is nothing that she has done or taken that has made any difference to her knowledge.  She states that she just recently came back from [place] where she was there for approximately 2 weeks and due to the continued discomfort her husband wanted her evaluated.  She denies any known fever but does report chills.  She reports generalized malaise over the past couple weeks but continued to feel that it was likely due to her activity level which was increased during her vacation.  She does have myasthenia gravis and reports that her flares typically are in a  setting of weakness with no other additional symptoms routinely that she is aware of.  She is unsure of any trauma or strain to her extremity that may have caused the discomfort.  She denies any palpitations or increased heart rate but does report lower blood pressure readings than her baseline over the last few days.    On my exam, patient is non-distressed; hemodynamically stable.  EKG findings did indicate a slight elevation in her P wave in lead II correlating with possible atrial enlargement however she has never been seen by a cardiologist but does follow-up regularly with her PCP so we will discuss follow-up with PCP for additional cardio vascular evaluation in the setting of a slightly abnormal EKG.  No ST elevation or depression noted.  Did obtain viral swabs to rule out underlying viral factor.  X-ray findings as noted above.  Due to recent travel we will also plan to do blood work to rule out underlying PE.  In the setting of both rheumatoid arthritis and myasthenia gravis will also obtain inflammatory lab markers to rule out underlying flare and disease process.  Unable to treat in office  with Toradol due to history of a bleeding ulcer and allergies to indomethacin and Celebrex.  Heart score was 2 without the troponin factor   Based on history, exam and work-up, I cannot rule out the possibility of a pulmonary embolism however with the length of time symptoms have been present along with hemodynamically stable vital signs this risk is low. Discussed ER followup, however patient would like to wait until lab work results.  Patient was advised on severe or acute symptoms that would prompt ER evaluation.  GI cocktail did not change the severity of the discomfort however she has also not had an episode while she has been in the clinic.  Due to her age and medical history along with a slightly elevated P wave I did advise follow-up with her PCP this week.  Did offer referral to cardiology based on  abnormal EKG findings however she would like to discuss with PCP first.  Did advise the use of Tylenol, heat, ice and over-the-counter acid reducers to assist with discomfort.  Patient is unable to take any form of NSAIDs.    Differentials: PE, Muscle strain, LVH, CHF, GERD, ACS  PLAN: Discussed above findings with patient including workup, results, treatment plan, and followup Call or return with any questions or concerns Spoke at length about changing or worsening of symptoms that should prompt going directly to the closest ER....  Patient is agreeable with assessment and plan.  Urgent Care Disposition:  Follow up with PCP  Patient's presentation is most consistent with acute illness / injury with systematic symptoms.    Electronically signed by: Charleen Tammy Lot, NP 09/05/2024 3:13 PM

## 2024-09-05 NOTE — ED Triage Notes (Addendum)
 Pt is here for right sided chest pain x 4 weeks and sob with activity.  Returned from [place] on Wednesday. Pt was seen at [facility] today and had blood work.  She was advised to come in as her D-dimer was elevated.   D-Dimer: 190.00 - 500.00 NG/ML FEU 500.00

## 2024-09-06 MED ORDER — LIDOCAINE 5 % EX PTCH
1.0000 | MEDICATED_PATCH | CUTANEOUS | Status: DC
Start: 2024-09-06 — End: 2024-09-06
  Administered 2024-09-06: 1 via TRANSDERMAL
  Filled 2024-09-06: qty 1

## 2024-09-06 NOTE — ED Provider Notes (Signed)
 [place] EMERGENCY DEPARTMENT AT [facility] Provider Note   CSN: [id number] Arrival date & time: 09/05/24  2216     Patient presents with: Chest Pain   Angel Roberts is a 63 y.o. female.   The patient is a middle-aged female presenting with a chief complaint of chest pain that has persisted for approximately four weeks. Initially described as a dull pain, it has recently intensified to a sharp character. The pain is centrally located and radiates to the right side. The patient reports associated symptoms of headaches and breathlessness, which she describes as a need to catch her breath intermittently. She denies any leg swelling, fever, or significant cough, although she mentions a mild dry cough. The patient has a history of rheumatoid arthritis and myasthenia gravis, for which she takes pyridostigmine  and uses herbal supplements. She recently returned from a two-week trip to [place], which coincided with an increase in symptom severity. There is no significant cardiac history, and she has not experienced similar discomfort for this duration before. The patient is allergic to shellfish and expresses concern about potential reactions to contrast media. History was obtained from the patient and her husband.   Chest Pain      Prior to Admission medications   Medication Sig Start Date End Date Taking? Authorizing Provider  levothyroxine (SYNTHROID, LEVOTHROID) 100 MCG tablet Take 100 mcg by mouth daily before breakfast.    [provider]  Multiple Vitamins-Minerals (MULTIVITAMIN PO) Take by mouth.    [provider]  naproxen (NAPROSYN) 500 MG tablet Take 500 mg by mouth 2 (two) times daily with a meal.    [provider]  Nutritional Supplements (DHEA PO) Take by mouth daily.    [provider]  Probiotic CAPS Take 1 capsule 2 (two) times daily by mouth.     [provider]  pyridostigmine  (MESTINON ) 60 MG tablet Take 1 tablet  (60 mg total) by mouth 3 (three) times daily. 12/02/18   Jenel Carlin POUR, MD    Allergies: Other, Augmentin [amoxicillin-pot clavulanate], Erythromycin, Flagyl [metronidazole], Indocin [indomethacin], Sulfa antibiotics, and Humira [adalimumab]    Review of Systems  Cardiovascular:  Positive for chest pain.    Updated Vital Signs BP 131/80   Pulse 72   Temp 98.2 F (36.8 C) (Oral)   Resp 12   Ht 5' 7 (1.702 m)   Wt 70.3 kg   SpO2 100%   BMI 24.28 kg/m   Physical Exam Vitals and nursing note reviewed.  Constitutional:      Appearance: She is well-developed.  HENT:     Head: Normocephalic and atraumatic.  Cardiovascular:     Rate and Rhythm: Normal rate and regular rhythm.  Pulmonary:     Effort: No respiratory distress.     Breath sounds: No stridor.  Abdominal:     General: There is no distension.  Musculoskeletal:     Cervical back: Normal range of motion.     Comments: Tenderness to right lateral chest wall without rash, deformity, lympahdenopathy  Skin:    Comments: Two small non painful vesicles on right chest under breast but not in area of discomfort  Neurological:     Mental Status: She is alert.     (all labs ordered are listed, but only abnormal results are displayed) Labs Reviewed  BASIC METABOLIC PANEL WITH GFR - Abnormal; Notable for the following components:      Result Value   Glucose, Bld 109 (*)  All other components within normal limits  CBC  TROPONIN I (HIGH SENSITIVITY)  TROPONIN I (HIGH SENSITIVITY)    EKG: EKG Interpretation Date/Time:  Sunday September 05 2024 22:25:19 EDT Ventricular Rate:  82 PR Interval:  142 QRS Duration:  92 QT Interval:  382 QTC Calculation: 446 R Axis:   70  Text Interpretation: Normal sinus rhythm Incomplete right bundle branch block Nonspecific ST abnormality Abnormal ECG When compared with ECG of 26-Feb-2015 11:28, PREVIOUS ECG IS PRESENT Confirmed by Lorette Mayo [phone]) on 09/05/2024 11:04:51  PM  Radiology: CT Angio Chest PE W and/or Wo Contrast Result Date: 09/06/2024 EXAM: CTA of the Chest with contrast for PE 09/05/2024 11:55:00 PM TECHNIQUE: CTA of the chest was performed after the administration of intravenous contrast. Multiplanar reformatted images are provided for review. MIP images are provided for review. Automated exposure control, iterative reconstruction, and/or weight based adjustment of the mA/kV was utilized to reduce the radiation dose to as low as reasonably achievable. COMPARISON: CT chest dated 02/26/2016. CLINICAL HISTORY: Pulmonary embolism (PE) suspected, low to intermediate prob, positive D-dimer. Chief complaints; Chest Pain. FINDINGS: PULMONARY ARTERIES: Pulmonary arteries are adequately opacified for evaluation. No pulmonary embolism. Main pulmonary artery is normal in caliber. MEDIASTINUM: The heart and pericardium demonstrate no acute abnormality. There is no acute abnormality of the thoracic aorta. LYMPH NODES: No mediastinal, hilar or axillary lymphadenopathy. LUNGS AND PLEURA: Stable subpleural nodules in the lower lobes, measuring up to 5 mm (image 89), benign. No follow up is recommended per Fleischner Society guidelines. Mild biapical pleural parenchymal scarring. No focal consolidation or pulmonary edema. No pleural effusion or pneumothorax. UPPER ABDOMEN: Limited images of the upper abdomen are unremarkable. SOFT TISSUES AND BONES: No acute bone or soft tissue abnormality. IMPRESSION: 1. No evidence of pulmonary embolism. 2. Negative CT chest. Electronically signed by: Pinkie Pebbles MD 09/06/2024 12:01 AM EDT RP Workstation: [id number]     Procedures   Medications Ordered in the ED  lidocaine  (LIDODERM ) 5 % 1 patch (1 patch Transdermal Patch Applied 09/06/24 0202)  iohexol  (OMNIPAQUE ) 350 MG/ML injection 75 mL (75 mLs Intravenous Contrast Given 09/05/24 2357)                                    Medical Decision Making Amount and/or Complexity of  Data Reviewed Labs: ordered. Radiology: ordered.  Risk Prescription drug management.  ED Course: The patient presented with chest pain that had transitioned from a dull to sharp sensation over the past four weeks, accompanied by headaches and breathlessness. The patient recently returned from a trip to [place], which seemed to exacerbate the symptoms. The patient has a history of rheumatoid arthritis and myasthenia gravis, for which they take pyridostigmine  and herbal supplements. There is no cardiac history, and the patient denies any swelling in the legs, fever, or significant cough. Physical examination revealed tenderness in the chest area worsened by stretching, but no significant pain upon deep breathing. The patient expressed concern about a potential shellfish allergy affecting contrast use for a CT scan. I discussed the low likelihood of a blood clot but decided to proceed with a CT scan to rule out any serious conditions as she was sent here for it. I'll get a NIF to assess any diaphragm-related issues. The patient was reassured about the potential for an allergic reaction to contrast and was offered Benadryl  as a precaution however it can exacerbate MG so will hold  off.  Differential Diagnosis: Differential diagnosis includes but is not limited to: musculoskeletal pain, pulmonary embolism, costochondritis, and anxiety-related dyspnea.   CT scan reassuring.  Patient did not want to stay to get her negative inspiratory force done which I think is reasonable.  She is vitally stable at time of discharge.  Will follow-up with her PCP for any other needs.  Suspect likely musculoskeletal causes at this point.     Final diagnoses:  Nonspecific chest pain    ED Discharge Orders     None          Camera Krienke, Selinda, MD 09/06/24 [phone]

## 2024-12-07 ENCOUNTER — Other Ambulatory Visit: Payer: Self-pay | Admitting: Family Medicine

## 2024-12-07 DIAGNOSIS — R221 Localized swelling, mass and lump, neck: Secondary | ICD-10-CM

## 2024-12-17 ENCOUNTER — Ambulatory Visit
Admission: RE | Admit: 2024-12-17 | Discharge: 2024-12-17 | Disposition: A | Discharge status: Home / Self Care | Source: Ambulatory Visit | Attending: Family Medicine | Admitting: Family Medicine

## 2024-12-17 DIAGNOSIS — R221 Localized swelling, mass and lump, neck: Secondary | ICD-10-CM

## 2024-12-17 MED ORDER — IOPAMIDOL (ISOVUE-300) INJECTION 61%
80.0000 mL | Freq: Once | INTRAVENOUS | Status: AC | PRN
  Administered 2024-12-17: 80 mL via INTRAVENOUS
